# Patient Record
Sex: Male | Born: 1965
Health system: Southern US, Community
[De-identification: ages and names within clinical notes are randomized; demographics above are authoritative.]

## PROBLEM LIST (undated history)

## (undated) ENCOUNTER — Ambulatory Visit: Source: Home / Self Care

## (undated) DIAGNOSIS — M109 Gout, unspecified: Secondary | ICD-10-CM

## (undated) DIAGNOSIS — I2699 Other pulmonary embolism without acute cor pulmonale: Secondary | ICD-10-CM

## (undated) DIAGNOSIS — N183 Chronic kidney disease, stage 3 unspecified: Secondary | ICD-10-CM

## (undated) DIAGNOSIS — I82409 Acute embolism and thrombosis of unspecified deep veins of unspecified lower extremity: Secondary | ICD-10-CM

## (undated) DIAGNOSIS — I10 Essential (primary) hypertension: Secondary | ICD-10-CM

## (undated) HISTORY — PX: OTHER SURGICAL HISTORY: SHX169

---

## 2008-11-14 ENCOUNTER — Emergency Department (HOSPITAL_COMMUNITY): Admission: EM | Admit: 2008-11-14 | Discharge: 2008-11-14 | Payer: Self-pay | Admitting: Emergency Medicine

## 2010-02-13 NOTE — ED Provider Notes (Signed)
°

## 2012-11-12 ENCOUNTER — Encounter (HOSPITAL_COMMUNITY): Payer: Self-pay | Admitting: Emergency Medicine

## 2012-11-12 ENCOUNTER — Emergency Department (HOSPITAL_COMMUNITY)
Admission: EM | Admit: 2012-11-12 | Discharge: 2012-11-12 | Disposition: A | Discharge status: Home / Self Care | Payer: 59 | Attending: Emergency Medicine | Admitting: Emergency Medicine

## 2012-11-12 DIAGNOSIS — Y92410 Unspecified street and highway as the place of occurrence of the external cause: Secondary | ICD-10-CM | POA: Insufficient documentation

## 2012-11-12 DIAGNOSIS — S80811A Abrasion, right lower leg, initial encounter: Secondary | ICD-10-CM

## 2012-11-12 DIAGNOSIS — Z23 Encounter for immunization: Secondary | ICD-10-CM | POA: Insufficient documentation

## 2012-11-12 DIAGNOSIS — IMO0002 Reserved for concepts with insufficient information to code with codable children: Secondary | ICD-10-CM | POA: Insufficient documentation

## 2012-11-12 DIAGNOSIS — S39012A Strain of muscle, fascia and tendon of lower back, initial encounter: Secondary | ICD-10-CM

## 2012-11-12 DIAGNOSIS — S336XXA Sprain of sacroiliac joint, initial encounter: Secondary | ICD-10-CM | POA: Insufficient documentation

## 2012-11-12 DIAGNOSIS — Y9389 Activity, other specified: Secondary | ICD-10-CM | POA: Insufficient documentation

## 2012-11-12 MED ORDER — HYDROCODONE-ACETAMINOPHEN 5-325 MG PO TABS: 1.0000 | ORAL_TABLET | ORAL | Status: DC | PRN

## 2012-11-12 MED ORDER — TETANUS-DIPHTH-ACELL PERTUSSIS 5-2.5-18.5 LF-MCG/0.5 IM SUSP
0.5000 mL | Freq: Once | INTRAMUSCULAR | Status: AC
  Administered 2012-11-12: 0.5 mL via INTRAMUSCULAR
  Filled 2012-11-12: qty 0.5

## 2012-11-12 MED ORDER — METHOCARBAMOL 500 MG PO TABS
1000.0000 mg | ORAL_TABLET | Freq: Once | ORAL | Status: DC
  Filled 2012-11-12: qty 2

## 2012-11-12 MED ORDER — HYDROCODONE-ACETAMINOPHEN 5-325 MG PO TABS
1.0000 | ORAL_TABLET | Freq: Once | ORAL | Status: DC
  Filled 2012-11-12: qty 1

## 2012-11-12 MED ORDER — METHOCARBAMOL 500 MG PO TABS: 500.0000 mg | ORAL_TABLET | Freq: Three times a day (TID) | ORAL | Status: DC
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
## 2012-11-12 NOTE — ED Provider Notes (Signed)
Medical screening examination/treatment/procedure(s) were performed by non-physician practitioner and as supervising physician I was immediately available for consultation/collaboration.  EKG Interpretation   None         Anysia Choi T Joshiah Traynham, MD 11/12/12 1554 

## 2012-11-12 NOTE — ED Notes (Signed)
Patient with no complaints at this time. Respirations even and unlabored. Skin warm/dry. Discharge instructions reviewed with patient at this time. Patient given opportunity to voice concerns/ask questions. Patient discharged at this time and left Emergency Department with steady gait.   
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
## 2015-10-14 ENCOUNTER — Other Ambulatory Visit (HOSPITAL_COMMUNITY): Payer: Self-pay | Admitting: Nephrology

## 2015-10-14 DIAGNOSIS — N183 Chronic kidney disease, stage 3 unspecified: Secondary | ICD-10-CM

## 2015-11-01 ENCOUNTER — Ambulatory Visit (HOSPITAL_COMMUNITY): Payer: No Typology Code available for payment source

## 2015-11-07 ENCOUNTER — Ambulatory Visit (HOSPITAL_COMMUNITY)
Admission: RE | Admit: 2015-11-07 | Discharge: 2015-11-07 | Disposition: A | Discharge status: Home / Self Care | Payer: 59 | Source: Ambulatory Visit | Attending: Nephrology | Admitting: Nephrology

## 2015-11-07 DIAGNOSIS — N183 Chronic kidney disease, stage 3 unspecified: Secondary | ICD-10-CM

## 2015-11-28 ENCOUNTER — Emergency Department (HOSPITAL_COMMUNITY): Payer: 59

## 2015-11-28 ENCOUNTER — Observation Stay (HOSPITAL_COMMUNITY)
Admission: EM | Admit: 2015-11-28 | Discharge: 2015-11-29 | Disposition: A | Discharge status: Home / Self Care | Payer: 59 | Attending: Internal Medicine | Admitting: Internal Medicine

## 2015-11-28 ENCOUNTER — Encounter (HOSPITAL_COMMUNITY): Payer: Self-pay | Admitting: *Deleted

## 2015-11-28 DIAGNOSIS — I82401 Acute embolism and thrombosis of unspecified deep veins of right lower extremity: Secondary | ICD-10-CM | POA: Diagnosis not present

## 2015-11-28 DIAGNOSIS — I2699 Other pulmonary embolism without acute cor pulmonale: Principal | ICD-10-CM | POA: Diagnosis present

## 2015-11-28 DIAGNOSIS — N183 Chronic kidney disease, stage 3 unspecified: Secondary | ICD-10-CM | POA: Diagnosis present

## 2015-11-28 DIAGNOSIS — F4321 Adjustment disorder with depressed mood: Secondary | ICD-10-CM | POA: Diagnosis not present

## 2015-11-28 DIAGNOSIS — Z79899 Other long term (current) drug therapy: Secondary | ICD-10-CM | POA: Insufficient documentation

## 2015-11-28 DIAGNOSIS — J811 Chronic pulmonary edema: Secondary | ICD-10-CM | POA: Insufficient documentation

## 2015-11-28 DIAGNOSIS — I82409 Acute embolism and thrombosis of unspecified deep veins of unspecified lower extremity: Secondary | ICD-10-CM | POA: Diagnosis present

## 2015-11-28 DIAGNOSIS — R0602 Shortness of breath: Secondary | ICD-10-CM | POA: Diagnosis present

## 2015-11-28 DIAGNOSIS — I129 Hypertensive chronic kidney disease with stage 1 through stage 4 chronic kidney disease, or unspecified chronic kidney disease: Secondary | ICD-10-CM | POA: Diagnosis not present

## 2015-11-28 DIAGNOSIS — I82411 Acute embolism and thrombosis of right femoral vein: Secondary | ICD-10-CM

## 2015-11-28 DIAGNOSIS — R0600 Dyspnea, unspecified: Secondary | ICD-10-CM

## 2015-11-28 DIAGNOSIS — I10 Essential (primary) hypertension: Secondary | ICD-10-CM | POA: Diagnosis present

## 2015-11-28 DIAGNOSIS — N289 Disorder of kidney and ureter, unspecified: Secondary | ICD-10-CM | POA: Diagnosis not present

## 2015-11-28 DIAGNOSIS — R778 Other specified abnormalities of plasma proteins: Secondary | ICD-10-CM | POA: Diagnosis present

## 2015-11-28 DIAGNOSIS — R7989 Other specified abnormal findings of blood chemistry: Secondary | ICD-10-CM

## 2015-11-28 DIAGNOSIS — R748 Abnormal levels of other serum enzymes: Secondary | ICD-10-CM

## 2015-11-28 HISTORY — DX: Essential (primary) hypertension: I10

## 2015-11-28 HISTORY — DX: Chronic kidney disease, stage 3 unspecified: N18.30

## 2015-11-28 HISTORY — DX: Chronic kidney disease, stage 3 (moderate): N18.3

## 2015-11-28 LAB — TROPONIN I
TROPONIN I: 0.06 ng/mL — AB (ref <0.03)
TROPONIN I: 0.04 ng/mL — AB (ref <0.03)

## 2015-11-28 LAB — HEPARIN LEVEL (UNFRACTIONATED): Heparin Unfractionated: 0.4 IU/mL (ref 0.30–0.70)

## 2015-11-28 LAB — CBC
HCT: 46.6 % (ref 39.0–52.0)
HEMOGLOBIN: 15.4 g/dL (ref 13.0–17.0)
MCH: 29.2 pg (ref 26.0–34.0)
MCHC: 33 g/dL (ref 30.0–36.0)
MCV: 88.4 fL (ref 78.0–100.0)
Platelets: 208 10*3/uL (ref 150–400)
RBC: 5.27 MIL/uL (ref 4.22–5.81)
RDW: 12.8 % (ref 11.5–15.5)
WBC: 10.7 10*3/uL — AB (ref 4.0–10.5)

## 2015-11-28 LAB — BASIC METABOLIC PANEL
ANION GAP: 9 (ref 5–15)
BUN: 31 mg/dL — ABNORMAL HIGH (ref 6–20)
CHLORIDE: 103 mmol/L (ref 101–111)
CO2: 24 mmol/L (ref 22–32)
Calcium: 9.6 mg/dL (ref 8.9–10.3)
Creatinine, Ser: 2.28 mg/dL — ABNORMAL HIGH (ref 0.61–1.24)
GFR calc non Af Amer: 32 mL/min — ABNORMAL LOW (ref >60)
GFR, EST AFRICAN AMERICAN: 37 mL/min — AB (ref >60)
Glucose, Bld: 89 mg/dL (ref 65–99)
Potassium: 4.7 mmol/L (ref 3.5–5.1)
SODIUM: 136 mmol/L (ref 135–145)

## 2015-11-28 LAB — D-DIMER, QUANTITATIVE: D-Dimer, Quant: 13.16 ug/mL-FEU — ABNORMAL HIGH (ref 0.00–0.50)

## 2015-11-28 LAB — PROTIME-INR
INR: 1.08
PROTHROMBIN TIME: 14 s (ref 11.4–15.2)

## 2015-11-28 MED ORDER — TECHNETIUM TO 99M ALBUMIN AGGREGATED
4.0000 | Freq: Once | INTRAVENOUS | Status: AC | PRN
  Administered 2015-11-28: 4 via INTRAVENOUS

## 2015-11-28 MED ORDER — LACTATED RINGERS IV SOLN
INTRAVENOUS | Status: DC
  Administered 2015-11-28: 23:00:00 via INTRAVENOUS

## 2015-11-28 MED ORDER — ONDANSETRON HCL 4 MG/2ML IJ SOLN: 4.0000 mg | Freq: Four times a day (QID) | INTRAMUSCULAR | Status: DC | PRN

## 2015-11-28 MED ORDER — LABETALOL HCL 5 MG/ML IV SOLN: 5.0000 mg | INTRAVENOUS | Status: DC | PRN

## 2015-11-28 MED ORDER — HYDROCODONE-ACETAMINOPHEN 5-325 MG PO TABS: 1.0000 | ORAL_TABLET | ORAL | Status: DC | PRN

## 2015-11-28 MED ORDER — TECHNETIUM TC 99M DIETHYLENETRIAME-PENTAACETIC ACID
30.0000 | Freq: Once | INTRAVENOUS | Status: AC | PRN
  Administered 2015-11-28: 32 via RESPIRATORY_TRACT

## 2015-11-28 MED ORDER — ACETAMINOPHEN 650 MG RE SUPP: 650.0000 mg | Freq: Four times a day (QID) | RECTAL | Status: DC | PRN

## 2015-11-28 MED ORDER — HEPARIN BOLUS VIA INFUSION
5000.0000 [IU] | Freq: Once | INTRAVENOUS | Status: AC
  Administered 2015-11-28: 5000 [IU] via INTRAVENOUS

## 2015-11-28 MED ORDER — ONDANSETRON HCL 4 MG PO TABS: 4.0000 mg | ORAL_TABLET | Freq: Four times a day (QID) | ORAL | Status: DC | PRN

## 2015-11-28 MED ORDER — ACETAMINOPHEN 325 MG PO TABS: 650.0000 mg | ORAL_TABLET | Freq: Four times a day (QID) | ORAL | Status: DC | PRN

## 2015-11-28 MED ORDER — HEPARIN (PORCINE) IN NACL 100-0.45 UNIT/ML-% IJ SOLN
1650.0000 [IU]/h | INTRAMUSCULAR | Status: DC
  Administered 2015-11-28 – 2015-11-29 (×2): 1650 [IU]/h via INTRAVENOUS
  Filled 2015-11-28 (×2): qty 250

## 2015-11-28 MED ORDER — HEPARIN SODIUM (PORCINE) 5000 UNIT/ML IJ SOLN: 4000.0000 [IU] | Freq: Once | INTRAMUSCULAR | Status: DC

## 2015-11-28 MED ORDER — SENNOSIDES-DOCUSATE SODIUM 8.6-50 MG PO TABS: 1.0000 | ORAL_TABLET | Freq: Every evening | ORAL | Status: DC | PRN

## 2015-11-28 NOTE — ED Triage Notes (Signed)
On Saturday pt states the he began having a racing heart and shortness of breath. He said this episode went away then occurred again later that night. Denies any pain or "racing heart" feelings at this time. States he has had a pressure in his chest over the weekend.

## 2015-11-28 NOTE — ED Notes (Signed)
Heparin bolus complete at this time. 

## 2015-11-28 NOTE — ED Notes (Signed)
Pt taken to radiology by Pattricia BossAnnie.

## 2015-11-28 NOTE — ED Notes (Signed)
Went to take pt up to room 327 Dr still in with pt. Will check back

## 2015-11-28 NOTE — ED Notes (Signed)
Pt returned from radiology.

## 2015-11-28 NOTE — H&P (Signed)
History and Physical    Ronald Gonzales:[id number] DOB: 1965-08-07 DOA: 11/28/2015  PCP: Colette Ribas, MD Consultants:  Wolf[facility] - nephrology Patient coming from: home - lives with wife, son; [place]: wife, [phone]  Chief Complaint: leg pain  HPI: Ronald Gonzales is a 50 y.o. male with medical history significant of HTN and CKD presenting with DVT/PE.  He reports that he felt fine on Saturday morning but on Saturday afternoon about 3pm, he used the bathroom and his heart started racing and he got SOB "like I've been running a marathon."  Calmed down about 10 minutes later but still didn't feel right.  Micah Flesher out with friends that night and when leaving, he was walking about 300 feet while carrying a large piece of artwork.  When he got to the car, he couldn't even lift it into the car because he was so exhausted.  A few minutes later, he was able to get his breath back.  Still not feeling himself that night and yesterday.  Also noticed R leg pain in the upper thigh region.  Was limping around and slept all afternoon yesterday.  Awoke about 6pm and got up for a while but then again slept through the night.  When he got up this AM, he decided to go in and be seen; he went to PCP and they referred him to the ER.  Does have decreased velocity of urine stream without other urinary or GU symptoms.  +constipation.  +anhedonia.  No SI/HI.  ED Course:  Per Dr. Adriana Simas: Doppler study right lower extremity shows an extensive DVT in his RLE. I was unable to do a CT angiogram of his chest secondary to his elevated creatinine. However, a V/ Q study of chest reveals a high probability of a pulmonary embolism.  Rx IV heparin. Admit to general medicine.    Review of Systems: As per HPI; otherwise 10 point review of systems reviewed and negative.   Ambulatory Status:  Ambulates without assistance  Past Medical History:  Diagnosis Date  . CKD (chronic kidney disease) stage 3, GFR 30-59 ml/min   . Hypertension     april 2017    History reviewed. No pertinent surgical history.  Social History   Social History  . Marital status: Married    Spouse name: N/A  . Number of children: N/A  . Years of education: N/A   Occupational History  . Acupuncturist    Social History Main Topics  . Smoking status: Never Smoker  . Smokeless tobacco: Never Used  . Alcohol use Yes     Comment: occasional  . Drug use: No  . Sexual activity: Not on file   Other Topics Concern  . Not on file   Social History Narrative  . No narrative on file    No Known Allergies  Family History  Problem Relation Age of Onset  . CVA Father     Prior to Admission medications   Medication Sig Start Date End Date Taking? Authorizing Provider  Flaxseed, Linseed, (FLAX SEED OIL PO) Take 1 capsule by mouth daily.   Yes Historical Provider, MD  lisinopril-hydrochlorothiazide (PRINZIDE,ZESTORETIC) 20-25 MG tablet Take 0.5 tablets by mouth daily.  09/01/15  Yes Historical Provider, MD    Physical Exam: Vitals:   11/28/15 1800 11/28/15 1927 11/28/15 2000 11/28/15 2207  BP: 130/81 117/81  127/78  Pulse: 83 86  (!) 101  Resp: 20 19  20   Temp:    98.4 F (36.9  C)  TempSrc:    Oral  SpO2: 97% 97%  96%  Weight:   125.5 kg (276 lb 9.6 oz)   Height:   5\' 7"  (1.702 m)      General:  Appears calm and comfortable and is NAD Eyes:  PERRL, EOMI, normal lids, iris ENT:  grossly normal hearing, lips & tongue, mmm Neck:  no LAD, masses or thyromegaly Cardiovascular:  RRR, no m/r/g. No LE edema.  Respiratory:  CTA bilaterally, no w/r/r. Normal respiratory effort. Abdomen:  soft, ntnd, NABS Skin:  no rash or induration seen on limited exam; there is no erythema or warmth of the R medial thigh Musculoskeletal:  grossly normal tone BUE/BLE, good ROM, no bony abnormality; +TTP of the R medial thigh without obvious edema Psychiatric:  grossly normal mood and somewhat blunted affect, speech fluent and appropriate,  AOx3 Neurologic:  CN 2-12 grossly intact, moves all extremities in coordinated fashion, sensation intact  Labs on Admission: I have personally reviewed following labs and imaging studies  CBC:  Recent Labs Lab 11/28/15 1138  WBC 10.7*  HGB 15.4  HCT 46.6  MCV 88.4  PLT 208   Basic Metabolic Panel:  Recent Labs Lab 11/28/15 1138  NA 136  K 4.7  CL 103  CO2 24  GLUCOSE 89  BUN 31*  CREATININE 2.28*  CALCIUM 9.6   GFR: Estimated Creatinine Clearance: 49.8 mL/min (by C-G formula based on SCr of 2.28 mg/dL (H)). Liver Function Tests: No results for input(s): AST, ALT, ALKPHOS, BILITOT, PROT, ALBUMIN in the last 168 hours. No results for input(s): LIPASE, AMYLASE in the last 168 hours. No results for input(s): AMMONIA in the last 168 hours. Coagulation Profile: No results for input(s): INR, PROTIME in the last 168 hours. Cardiac Enzymes:  Recent Labs Lab 11/28/15 1138  TROPONINI 0.06*   BNP (last 3 results) No results for input(s): PROBNP in the last 8760 hours. HbA1C: No results for input(s): HGBA1C in the last 72 hours. CBG: No results for input(s): GLUCAP in the last 168 hours. Lipid Profile: No results for input(s): CHOL, HDL, LDLCALC, TRIG, CHOLHDL, LDLDIRECT in the last 72 hours. Thyroid Function Tests: No results for input(s): TSH, T4TOTAL, FREET4, T3FREE, THYROIDAB in the last 72 hours. Anemia Panel: No results for input(s): VITAMINB12, FOLATE, FERRITIN, TIBC, IRON, RETICCTPCT in the last 72 hours. Urine analysis: No results found for: COLORURINE, APPEARANCEUR, LABSPEC, PHURINE, GLUCOSEU, HGBUR, BILIRUBINUR, KETONESUR, PROTEINUR, UROBILINOGEN, NITRITE, LEUKOCYTESUR  Creatinine Clearance: Estimated Creatinine Clearance: 49.8 mL/min (by C-G formula based on SCr of 2.28 mg/dL (H)).  Sepsis Labs: @LABRCNTIP (procalcitonin:4,lacticidven:4) )No results found for this or any previous visit (from the past 240 hour(s)).   Radiological Exams on Admission: Dg  Chest 2 View  Result Date: 11/28/2015 CLINICAL DATA:  Chest pressure.  Rapid heart rate. EXAM: CHEST  2 VIEW COMPARISON:  No recent prior . FINDINGS: Mediastinum hilar structures normal. Low lung volumes. Mild left base infiltrate. No pleural effusion or pneumothorax. No acute bony abnormality. IMPRESSION: Low lung volumes.  Mild left base infiltrate. Electronically Signed   By: Maisie Fushomas  Register   On: 11/28/2015 11:23   Nm Pulmonary Perf And Vent  Addendum Date: 11/28/2015   ADDENDUM REPORT: 11/28/2015 17:10 ADDENDUM: Critical Value/emergent results were called by telephone at the time of interpretation on 11/28/2015 at 5:10 pm to Dr. Donnetta HutchingBRIAN COOK , who verbally acknowledged these results. Electronically Signed   By: Bretta BangWilliam  Woodruff III M.D.   On: 11/28/2015 17:10   Result Date: 11/28/2015  CLINICAL DATA:  Shortness of breath for 3 days EXAM: NUCLEAR MEDICINE VENTILATION - PERFUSION LUNG SCAN VIEWS: Anterior, posterior, left lateral, right lateral, RPO, LPO, RAO, LAO -ventilation and perfusion RADIOPHARMACEUTICALS:  32.0 mCi Technetium-66m DTPA aerosol inhalation and 4.0 mCi Technetium-66m MAA IV COMPARISON:  Chest radiograph November 28, 2015 FINDINGS: Ventilation: Radiotracer uptake on the ventilation study is homogeneous and symmetric bilaterally. No focal ventilation defects identified. Perfusion: On the perfusion study, there is significant decreased radiotracer uptake in the superior and inferior segments of the lingula, best appreciable on the LPO and left lateral views. On the right lateral and RPO views, there is decreased uptake in the anterior segment right upper lobe. These areas appear to represent significant ventilation/perfusion mismatches. IMPRESSION: Segmental perfusion defects are noted in the superior and inferior segments of the lingula and in the anterior segment of the right upper lobe. There are no corresponding ventilation defects. This study is felt to represent an overall high  probability of pulmonary embolus. Electronically Signed: By: Bretta Bang III M.D. On: 11/28/2015 17:05   US Venous Img Lower Unilateral Right  Result Date: 11/28/2015 CLINICAL DATA:  50 year old male with right thigh pain and shortness breath EXAM: RIGHT LOWER EXTREMITY VENOUS DOPPLER ULTRASOUND TECHNIQUE: Gray-scale sonography with graded compression, as well as color Doppler and duplex ultrasound were performed to evaluate the lower extremity deep venous systems from the level of the common femoral vein and including the common femoral, femoral, profunda femoral, popliteal and calf veins including the posterior tibial, peroneal and gastrocnemius veins when visible. The superficial great saphenous vein was also interrogated. Spectral Doppler was utilized to evaluate flow at rest and with distal augmentation maneuvers in the common femoral, femoral and popliteal veins. COMPARISON:  None. FINDINGS: Contralateral Common Femoral Vein: Respiratory phasicity is normal and symmetric with the symptomatic side. No evidence of thrombus. Normal compressibility. Common Femoral Vein: Partially compressible. Nonocclusive thrombus is noted within the common femoral vein. Saphenofemoral Junction: No evidence of thrombus. Normal compressibility and flow on color Doppler imaging. Profunda Femoral Vein: Occlusive thrombus within the profunda femoral vein. Femoral Vein: Occlusive thrombus extends into the femoral vein throughout the thigh. The vessels expanded and contains low-level internal echoes. Noncompressible. Popliteal Vein: Thrombus extends throughout the popliteal vein. Calf Veins: Thrombus extends into the visualized deep calf veins. Superficial Great Saphenous Vein: No evidence of thrombus. Normal compressibility and flow on color Doppler imaging. Venous Reflux:  None. Other Findings:  None. IMPRESSION: Positive for extensive deep venous thrombosis throughout the right lower extremity with nonocclusive thrombus in  the common femoral vein, and occlusive thrombus in the femoral vein, profunda femoral vein, popliteal vein and visualized calf veins. Electronically Signed   By: Malachy Moan M.D.   On: 11/28/2015 14:42    EKG: Independently reviewed.  NSR with rate 95; normal EKG  Assessment/Plan Principal Problem:   Acute pulmonary embolism (HCC) Active Problems:   DVT (deep venous thrombosis) (HCC)   CKD (chronic kidney disease) stage 3, GFR 30-59 ml/min   Essential hypertension   Elevated troponin   Adjustment disorder with depressed mood   DVT/PE -Atypical presentation - no significant leg PE findings, no tachycardia, normal O2 sats - the ER doctor did a good job to find this! -After long discussion of possible reasons, immobility associated with his job and lifestyle are the most likely reasons (he sits for long periods of time and drives a fair amount) -That said, he does have constipation that is fairly new for him and  is 50yo; I recommended that he go ahead and call to schedule his outpatient screening colonoscopy now. -He does have some urinary stream velocity issues and prostate cancer screening should also be discussed - but the risk/benefits here are much less clear -CT C/A/P for screening is not indicated at this time due to lack of other symptoms -Patient started on Heparin drip in ER; we discussed Lovenox instead but the patient prefers Heparin at this time -We also discussed long-term anticoagulation, as he will need at least 3-6 months of treatment -He prefers not to have Lovenox at all and also wishes to avoid Coumadin -Will, therefore, need NOAC; CM consult requested to discuss options based on cost -Normally, an extensive DVT with PE would clearly require hospitalization with telemetry; in this case with his hemodynamic stability and clinical presentation, anticipate discharge tomorrow if no new issues arise  CKD -Patient is uncertain what his baseline renal function is -He has  seen nephrology once and had a normal renal US -Suspect that this is the result of long-standing suboptimal HTN control -He may also have a component of AKI at this time; will hydrate and follow -Hold ACE and HCTZ  HTN -Moderate control in ER -Will hold home meds (ACE/HCTZ) due to renal dysfunction and possible component of AKI -Cover with Labetalol prn  Elevated troponin -Normal EKG -No chest pain -Normal vitals -Possible troponin leak due to AKI/CKD vs. demand ischemia -Will trend troponins -Based on normal vitals and EKG and no symptoms, will not continue to monitor on telemetry at this time  Adjustment disorder -We had a long discussion about this -Patient with anhedonia, lack of interest in anything once he gets home from work -He is very unhappy with his job and this seems to be impacting the rest of his life -Patient strongly encouraged to consider SSRI therapy -He is not interested in counseling at this time -Denies SI/HI -Suggest outpatient f/u   DVT prophylaxis: Heparin drip Code Status: DNI - confirmed with patient/family Family Communication: Wife present throughout evaluation  Disposition Plan:  Home once clinically improved Consults called: None  Admission status: It is my clinical opinion that referral for OBSERVATION is reasonable and necessary in this patient based on the above information provided. The aforementioned taken together are felt to place the patient at high risk for further clinical deterioration. However it is anticipated that the patient may be medically stable for discharge from the hospital within 24 to 48 hours.     Ronald BlueJennifer Kaycen Whitworth MD [facility]  If 7PM-7AM, please contact night-coverage [email or web address] Password [email or web address]  11/28/2015, 10:12 PM

## 2015-11-28 NOTE — ED Notes (Signed)
Radiology discontinued heparin drip for test, heparin drip restarted at 1650 units/hr.

## 2015-11-28 NOTE — ED Provider Notes (Signed)
[facility]-EM[facility]Y DEPT Provider Note   CSN: [id number] Arrival date & time: 11/28/15  1013  By signing my name below, I, Majel Homer, attest that this documentation has been prepared under the direction and in the presence of Donnetta Hutching, MD . Electronically Signed: Majel Homer, Scribe. 11/28/2015. 1:02 PM.  History   Chief Complaint Chief Complaint  Patient presents with  . Chest Pain   The history is provided by the patient. No language interpreter was used.   HPI Comments: Ronald Gonzales is a 50 y.o. male with PMHx of HTN, who presents to the Emergency Department complaining of gradually worsening, shortness of breath that began 3 days ago. Pt reports his symptoms began with a sensation of palpitations 3 days ago followed by an episode of shortness of breath; he notes this episode lasted ~20 seconds. He notes he tried to carry a large painting that same evening at ~11:00 in the evening when he experienced another episode of shortness of breath. He states he was walking up the stairs at church yesterday in which he began to feel "winded" and mildly short of breath again. He denies chest pain during any of these episodes. Pt states this is the first time he has experienced similar symptoms. He notes he received blood work at his PCP's office 3 weeks ago and was told of abnormal creatinine levels. He states he then received an ultrasound of his kidneys 3 weeks ago that returned normal. Pt denies FHx of heart problems at an early age; though, he notes his mother and father had issues as they got older. Pt also complains of a "painful spot" to his right inner thigh but denies any injury.   PCP: Dr. Phillips Odor   Past Medical History:  Diagnosis Date  . Hypertension    april 2017   Patient Active Problem List   Diagnosis Date Noted  . Pulmonary edema 11/28/2015   History reviewed. No pertinent surgical history.  Home Medications    Prior to Admission medications   Medication Sig Start Date End  Date Taking? Authorizing Provider  Flaxseed, Linseed, (FLAX SEED OIL PO) Take 1 capsule by mouth daily.   Yes Historical Provider, MD  lisinopril-hydrochlorothiazide (PRINZIDE,ZESTORETIC) 20-25 MG tablet Take 0.5 tablets by mouth daily.  09/01/15  Yes Historical Provider, MD    Family History No family history on file.  Social History Social History  Substance Use Topics  . Smoking status: Never Smoker  . Smokeless tobacco: Never Used  . Alcohol use No   Allergies   Patient has no known allergies.  Review of Systems Review of Systems  Respiratory: Positive for shortness of breath.   Cardiovascular: Positive for palpitations.  Musculoskeletal:       Painful spot to his right proximal medial thigh    Physical Exam Updated Vital Signs BP 116/81 (BP Location: Left Arm)   Pulse 93   Temp 97.5 F (36.4 C) (Oral)   Resp 18   Ht 5\' 11"  (1.803 m)   Wt 280 lb (127 kg)   SpO2 100%   BMI 39.05 kg/m   Physical Exam  Constitutional: He is oriented to person, place, and time. He appears well-developed and well-nourished.  Obese but no acute distress  HENT:  Head: Normocephalic and atraumatic.  Eyes: Conjunctivae are normal.  Neck: Neck supple.  Cardiovascular: Normal rate and regular rhythm.   Pulmonary/Chest: Effort normal and breath sounds normal.  Abdominal: Soft. Bowel sounds are normal.  Musculoskeletal: Normal range of motion.  Neurological: He is alert and oriented to person, place, and time.  Skin: Skin is warm and dry.  Psychiatric: He has a normal mood and affect. His behavior is normal.  Nursing note and vitals reviewed.  ED Treatments / Results  Labs (all labs ordered are listed, but only abnormal results are displayed) Labs Reviewed  BASIC METABOLIC PANEL - Abnormal; Notable for the following:       Result Value   BUN 31 (*)    Creatinine, Ser 2.28 (*)    GFR calc non Af Amer 32 (*)    GFR calc Af Amer 37 (*)    All other components within normal limits    CBC - Abnormal; Notable for the following:    WBC 10.7 (*)    All other components within normal limits  TROPONIN I - Abnormal; Notable for the following:    Troponin I 0.06 (*)    All other components within normal limits  D-DIMER, QUANTITATIVE (NOT AT [facility]) - Abnormal; Notable for the following:    D-Dimer, Quant 13.16 (*)    All other components within normal limits  PROTIME-INR  HEPARIN LEVEL (UNFRACTIONATED)  HEPARIN LEVEL (UNFRACTIONATED)  CBC  PROTIME-INR    EKG  EKG Interpretation None       Radiology Dg Chest 2 View  Result Date: 11/28/2015 CLINICAL DATA:  Chest pressure.  Rapid heart rate. EXAM: CHEST  2 VIEW COMPARISON:  No recent prior . FINDINGS: Mediastinum hilar structures normal. Low lung volumes. Mild left base infiltrate. No pleural effusion or pneumothorax. No acute bony abnormality. IMPRESSION: Low lung volumes.  Mild left base infiltrate. Electronically Signed   By: Maisie Fus  Register   On: 11/28/2015 11:23   Nm Pulmonary Perf And Vent  Addendum Date: 11/28/2015   ADDENDUM REPORT: 11/28/2015 17:10 ADDENDUM: Critical Value/emergent results were called by telephone at the time of interpretation on 11/28/2015 at 5:10 pm to Dr. Donnetta Hutching , who verbally acknowledged these results. Electronically Signed   By: Bretta Bang III M.D.   On: 11/28/2015 17:10   Result Date: 11/28/2015 CLINICAL DATA:  Shortness of breath for 3 days EXAM: NUCLEAR MEDICINE VENTILATION - PERFUSION LUNG SCAN VIEWS: Anterior, posterior, left lateral, right lateral, RPO, LPO, RAO, LAO -ventilation and perfusion RADIOPHARMACEUTICALS:  32.0 mCi Technetium-2m DTPA aerosol inhalation and 4.0 mCi Technetium-78m MAA IV COMPARISON:  Chest radiograph November 28, 2015 FINDINGS: Ventilation: Radiotracer uptake on the ventilation study is homogeneous and symmetric bilaterally. No focal ventilation defects identified. Perfusion: On the perfusion study, there is significant decreased radiotracer uptake in  the superior and inferior segments of the lingula, best appreciable on the LPO and left lateral views. On the right lateral and RPO views, there is decreased uptake in the anterior segment right upper lobe. These areas appear to represent significant ventilation/perfusion mismatches. IMPRESSION: Segmental perfusion defects are noted in the superior and inferior segments of the lingula and in the anterior segment of the right upper lobe. There are no corresponding ventilation defects. This study is felt to represent an overall high probability of pulmonary embolus. Electronically Signed: By: Bretta Bang III M.D. On: 11/28/2015 17:05   US Venous Img Lower Unilateral Right  Result Date: 11/28/2015 CLINICAL DATA:  50 year old male with right thigh pain and shortness breath EXAM: RIGHT LOWER EXTREMITY VENOUS DOPPLER ULTRASOUND TECHNIQUE: Gray-scale sonography with graded compression, as well as color Doppler and duplex ultrasound were performed to evaluate the lower extremity deep venous systems from the level of the common  femoral vein and including the common femoral, femoral, profunda femoral, popliteal and calf veins including the posterior tibial, peroneal and gastrocnemius veins when visible. The superficial great saphenous vein was also interrogated. Spectral Doppler was utilized to evaluate flow at rest and with distal augmentation maneuvers in the common femoral, femoral and popliteal veins. COMPARISON:  None. FINDINGS: Contralateral Common Femoral Vein: Respiratory phasicity is normal and symmetric with the symptomatic side. No evidence of thrombus. Normal compressibility. Common Femoral Vein: Partially compressible. Nonocclusive thrombus is noted within the common femoral vein. Saphenofemoral Junction: No evidence of thrombus. Normal compressibility and flow on color Doppler imaging. Profunda Femoral Vein: Occlusive thrombus within the profunda femoral vein. Femoral Vein: Occlusive thrombus extends  into the femoral vein throughout the thigh. The vessels expanded and contains low-level internal echoes. Noncompressible. Popliteal Vein: Thrombus extends throughout the popliteal vein. Calf Veins: Thrombus extends into the visualized deep calf veins. Superficial Great Saphenous Vein: No evidence of thrombus. Normal compressibility and flow on color Doppler imaging. Venous Reflux:  None. Other Findings:  None. IMPRESSION: Positive for extensive deep venous thrombosis throughout the right lower extremity with nonocclusive thrombus in the common femoral vein, and occlusive thrombus in the femoral vein, profunda femoral vein, popliteal vein and visualized calf veins. Electronically Signed   By: Malachy MoanHeath  McCullough M.D.   On: 11/28/2015 14:42   Procedures Procedures (including critical care time)  Medications Ordered in ED Medications  heparin ADULT infusion 100 units/mL (25000 units/21850mL sodium chloride 0.45%) (1,650 Units/hr Intravenous New Bag/Given 11/28/15 1501)  heparin bolus via infusion 5,000 Units (5,000 Units Intravenous Bolus from Bag 11/28/15 1500)  technetium albumin aggregated (MAA) injection solution 4 millicurie (4 millicuries Intravenous Contrast Given 11/28/15 1625)  technetium TC 62M diethylenetriame-pentaacetic acid (DTPA) injection 30 millicurie (32 millicuries Inhalation Given 11/28/15 1615)    DIAGNOSTIC STUDIES:  Oxygen Saturation is 100% on RA, normal by my interpretation.    COORDINATION OF CARE:  12:53 PM Discussed treatment plan with pt at bedside and pt agreed to plan.  Initial Impression / Assessment and Plan / ED Course  I have reviewed the triage vital signs and the nursing notes.  Pertinent labs & imaging results that were available during my care of the patient were reviewed by me and considered in my medical decision making (see chart for details).  Clinical Course     Will obtain CXR, blood work, EKG and US of his right leg to rule out a DVT.    Doppler  study right lower extremity shows an extensive DVT in his RLE. I was unable to do a CT angiogram of his chest secondary to his elevated creatinine. However, a V/ Q study of chest reveals a high probability of a pulmonary embolism.  Rx IV heparin. Admit to general medicine.    CRITICAL CARE Performed by: Donnetta HutchingOOK,Shyniece Scripter  ?  Total critical care time: 30 minutes  Critical care time was exclusive of separately billable procedures and treating other patients.  Critical care was necessary to treat or prevent imminent or life-threatening deterioration.  Critical care was time spent personally by me on the following activities: development of treatment plan with patient and/or surrogate as well as nursing, discussions with consultants, evaluation of patient's response to treatment, examination of patient, obtaining history from patient or surrogate, ordering and performing treatments and interventions, ordering and review of laboratory studies, ordering and review of radiographic studies, pulse oximetry and re-evaluation of patient's condition. I personally performed the services described in this documentation, which was  scribed in my presence. The recorded information has been reviewed and is accurate.   Final Clinical Impressions(s) / ED Diagnoses   Final diagnoses:  Dyspnea  Other acute pulmonary embolism without acute cor pulmonale (HCC)  Acute deep vein thrombosis (DVT) of femoral vein of right lower extremity (HCC)  Renal insufficiency    New Prescriptions New Prescriptions   No medications on file     Donnetta HutchingBrian Kelvyn Schunk, MD 11/28/15 1745

## 2015-11-28 NOTE — ED Notes (Signed)
CRITICAL VALUE ALERT  Critical value received:  Troponin 0.06  Date of notification: 11/28/15  Time of notification:    1224  Critical value read back:Yes.    Nurse who received alert:  Viviano SimasLauren Shadara Lopez, RN  MD notified (1st page):  [facility]

## 2015-11-28 NOTE — Progress Notes (Signed)
ANTICOAGULATION CONSULT NOTE - Initial Consult  Pharmacy Consult for Heparin Indication: pulmonary embolus  No Known Allergies  Patient Measurements: Height: 5\' 11"  (180.3 cm) Weight: 280 lb (127 kg) IBW/kg (Calculated) : 75.3 Heparin Dosing Weight: 103.9 kg  Vital Signs: Temp: 97.5 F (36.4 C) (11/06 1043) Temp Source: Oral (11/06 1043) BP: 117/85 (11/06 1328) Pulse Rate: 83 (11/06 1328)  Labs:  Recent Labs  11/28/15 1138  HGB 15.4  HCT 46.6  PLT 208  CREATININE 2.28*  TROPONINI 0.06*    Estimated Creatinine Clearance: 53.2 mL/min (by C-G formula based on SCr of 2.28 mg/dL (H)).   Medical History: Past Medical History:  Diagnosis Date  . Hypertension    april 2017    Medications:   (Not in a hospital admission)  Assessment: 50 yo male ED patient Pharmacy protocol heparin for PE Labs reviewed PTA medications reviewed  Goal of Therapy:  Heparin level 0.3-0.7 units/ml Monitor platelets by anticoagulation protocol: Yes   Plan:  Give 5000 units bolus x 1 Start heparin infusion at 1650 units/hr Check anti-Xa level in 6-8 hours and daily while on heparin Continue to monitor H&H and platelets  Raquel JamesPittman, Adeleine Pask Bennett 11/28/2015,2:51 PM

## 2015-11-28 NOTE — ED Notes (Signed)
Ambulated to restroom with no difficulties.

## 2015-11-28 NOTE — ED Notes (Signed)
Taking pt to [address].

## 2015-11-29 DIAGNOSIS — I2690 Septic pulmonary embolism without acute cor pulmonale: Secondary | ICD-10-CM

## 2015-11-29 LAB — CBC
HEMATOCRIT: 44.4 % (ref 39.0–52.0)
HEMOGLOBIN: 14.6 g/dL (ref 13.0–17.0)
MCH: 29 pg (ref 26.0–34.0)
MCHC: 32.9 g/dL (ref 30.0–36.0)
MCV: 88.1 fL (ref 78.0–100.0)
Platelets: 216 10*3/uL (ref 150–400)
RBC: 5.04 MIL/uL (ref 4.22–5.81)
RDW: 12.9 % (ref 11.5–15.5)
WBC: 10.6 10*3/uL — AB (ref 4.0–10.5)

## 2015-11-29 LAB — BASIC METABOLIC PANEL
ANION GAP: 8 (ref 5–15)
BUN: 31 mg/dL — ABNORMAL HIGH (ref 6–20)
CALCIUM: 8.9 mg/dL (ref 8.9–10.3)
CO2: 22 mmol/L (ref 22–32)
Chloride: 104 mmol/L (ref 101–111)
Creatinine, Ser: 1.96 mg/dL — ABNORMAL HIGH (ref 0.61–1.24)
GFR, EST AFRICAN AMERICAN: 44 mL/min — AB (ref >60)
GFR, EST NON AFRICAN AMERICAN: 38 mL/min — AB (ref >60)
GLUCOSE: 87 mg/dL (ref 65–99)
POTASSIUM: 4.1 mmol/L (ref 3.5–5.1)
SODIUM: 134 mmol/L — AB (ref 135–145)

## 2015-11-29 LAB — TROPONIN I
TROPONIN I: 0.03 ng/mL — AB (ref <0.03)
TROPONIN I: 0.03 ng/mL — AB (ref <0.03)

## 2015-11-29 LAB — HEPARIN LEVEL (UNFRACTIONATED): Heparin Unfractionated: 0.34 IU/mL (ref 0.30–0.70)

## 2015-11-29 LAB — PROTIME-INR
INR: 1.11
Prothrombin Time: 14.4 seconds (ref 11.4–15.2)

## 2015-11-29 MED ORDER — RIVAROXABAN 15 MG PO TABS
15.0000 mg | ORAL_TABLET | Freq: Two times a day (BID) | ORAL | Status: DC
  Administered 2015-11-29: 15 mg via ORAL
  Filled 2015-11-29: qty 1

## 2015-11-29 MED ORDER — RIVAROXABAN 20 MG PO TABS: 20.0000 mg | ORAL_TABLET | Freq: Every day | ORAL | 2 refills | Status: DC

## 2015-11-29 MED ORDER — HYDROCODONE-ACETAMINOPHEN 5-325 MG PO TABS: 1.0000 | ORAL_TABLET | ORAL | 0 refills | Status: DC | PRN

## 2015-11-29 MED ORDER — RIVAROXABAN (XARELTO) VTE STARTER PACK (15 & 20 MG)
ORAL_TABLET | ORAL | 0 refills | Status: DC
Start: 2015-11-29 — End: 2016-01-27

## 2015-11-29 NOTE — Discharge Summary (Signed)
Physician Discharge Summary  Ronald MeyerHarvey G Pedone [id number] DOB: 1965/10/22 DOA: 11/28/2015  PCP: Colette RibasGOLDING, JOHN CABOT, MD  Admit date: 11/28/2015 Discharge date: 11/29/2015  Time spent: 45 minutes  Recommendations for Outpatient Follow-up:  -Will be discharged home today. -Advised to follow up with PCP in 2 weeks for follow up on on BP and newly diagnosed DVT/PE.   Discharge Diagnoses:  Principal Problem:   Acute pulmonary embolism (HCC) Active Problems:   DVT (deep venous thrombosis) (HCC)   CKD (chronic kidney disease) stage 3, GFR 30-59 ml/min   Essential hypertension   Elevated troponin   Adjustment disorder with depressed mood   Discharge Condition: Stable and improved  Filed Weights   11/28/15 1043 11/28/15 2000  Weight: 127 kg (280 lb) 125.5 kg (276 lb 9.6 oz)    History of present illness:  As per Dr. Ophelia CharterYates on 11/6: Ronald Gonzales is a 50 y.o. male with medical history significant of HTN and CKD presenting with DVT/PE.  He reports that he felt fine on Saturday morning but on Saturday afternoon about 3pm, he used the bathroom and his heart started racing and he got SOB "like I've been running a marathon."  Calmed down about 10 minutes later but still didn't feel right.  Micah FlesherWent out with friends that night and when leaving, he was walking about 300 feet while carrying a large piece of artwork.  When he got to the car, he couldn't even lift it into the car because he was so exhausted.  A few minutes later, he was able to get his breath back.  Still not feeling himself that night and yesterday.  Also noticed R leg pain in the upper thigh region.  Was limping around and slept all afternoon yesterday.  Awoke about 6pm and got up for a while but then again slept through the night.  When he got up this AM, he decided to go in and be seen; he went to PCP and they referred him to the ER.  Does have decreased velocity of urine stream without other urinary or GU symptoms.  +constipation.   +anhedonia.  No SI/HI.  ED Course:  Per Dr. Adriana Simasook: Doppler study right lower extremity shows an extensive DVT in his RLE. I wasunable to do a CT angiogram of his chest secondary to his elevated creatinine. However, a V/Q study of chest reveals a high probability of a pulmonary embolism. Rx IV heparin. Admit to general medicine.   Hospital Course:   DVT/PE -Has been started on xarelto. -no oxygen requirements.  HTN -Well controlled. -Has been taken off ACE-I/diuretic due to ARF. -Will not start new BP med on DC. -Advised to keep a BP log to bring into his PCP at time of follow up appointment.  ARF -Lisinopril/HCTz have been discontinued. -This will need to be rechecked at hospital follow up appointment. -Cr on Dc is 1.96.  Procedures:  None   Consultations:  None  Discharge Instructions  Discharge Instructions    Diet - low sodium heart healthy    Complete by:  As directed    Increase activity slowly    Complete by:  As directed        Medication List    STOP taking these medications   lisinopril-hydrochlorothiazide 20-25 MG tablet Commonly known as:  PRINZIDE,ZESTORETIC     TAKE these medications   FLAX SEED OIL PO Take 1 capsule by mouth daily.   HYDROcodone-acetaminophen 5-325 MG tablet Commonly known as:  NORCO/VICODIN  Take 1-2 tablets by mouth every 4 (four) hours as needed for moderate pain.   Rivaroxaban 15 & 20 MG Tbpk Take as directed on package: Start with one 15mg  tablet by mouth twice a day with food. On Day 22, switch to one 20mg  tablet once a day with food.   rivaroxaban 20 MG Tabs tablet Commonly known as:  XARELTO Take 1 tablet (20 mg total) by mouth daily with supper.      No Known Allergies Follow-up Information    Colette Ribas, MD. Go on 12/05/2015.   Specialty:  Family Medicine Why:  At 12:30pm for a 1pm appointment Contact information: [address] [place] [address] [phone]            The  results of significant diagnostics from this hospitalization (including imaging, microbiology, ancillary and laboratory) are listed below for reference.    Significant Diagnostic Studies: Dg Chest 2 View  Result Date: 11/28/2015 CLINICAL DATA:  Chest pressure.  Rapid heart rate. EXAM: CHEST  2 VIEW COMPARISON:  No recent prior . FINDINGS: Mediastinum hilar structures normal. Low lung volumes. Mild left base infiltrate. No pleural effusion or pneumothorax. No acute bony abnormality. IMPRESSION: Low lung volumes.  Mild left base infiltrate. Electronically Signed   By: Maisie Fus  Register   On: 11/28/2015 11:23   US Renal  Result Date: 11/07/2015 CLINICAL DATA:  Stage III chronic renal disease EXAM: RENAL / URINARY TRACT ULTRASOUND COMPLETE COMPARISON:  None. FINDINGS: Right Kidney: Length: 10.5 cm. Echogenicity and renal cortical thickness are within normal limits. No mass, perinephric fluid, or hydronephrosis visualized. No sonographically demonstrable calculus or ureterectasis. Left Kidney: Length: 11.6 cm. Echogenicity and renal cortical thickness are within normal limits. No mass, perinephric fluid, or hydronephrosis visualized. No sonographically demonstrable calculus or ureterectasis. Bladder: Appears normal for degree of bladder distention. IMPRESSION: Study within normal limits. Electronically Signed   By: Bretta Bang III M.D.   On: 11/07/2015 10:38   Nm Pulmonary Perf And Vent  Addendum Date: 11/28/2015   ADDENDUM REPORT: 11/28/2015 17:10 ADDENDUM: Critical Value/emergent results were called by telephone at the time of interpretation on 11/28/2015 at 5:10 pm to Dr. Donnetta Hutching , who verbally acknowledged these results. Electronically Signed   By: Bretta Bang III M.D.   On: 11/28/2015 17:10   Result Date: 11/28/2015 CLINICAL DATA:  Shortness of breath for 3 days EXAM: NUCLEAR MEDICINE VENTILATION - PERFUSION LUNG SCAN VIEWS: Anterior, posterior, left lateral, right lateral, RPO, LPO,  RAO, LAO -ventilation and perfusion RADIOPHARMACEUTICALS:  32.0 mCi Technetium-49m DTPA aerosol inhalation and 4.0 mCi Technetium-13m MAA IV COMPARISON:  Chest radiograph November 28, 2015 FINDINGS: Ventilation: Radiotracer uptake on the ventilation study is homogeneous and symmetric bilaterally. No focal ventilation defects identified. Perfusion: On the perfusion study, there is significant decreased radiotracer uptake in the superior and inferior segments of the lingula, best appreciable on the LPO and left lateral views. On the right lateral and RPO views, there is decreased uptake in the anterior segment right upper lobe. These areas appear to represent significant ventilation/perfusion mismatches. IMPRESSION: Segmental perfusion defects are noted in the superior and inferior segments of the lingula and in the anterior segment of the right upper lobe. There are no corresponding ventilation defects. This study is felt to represent an overall high probability of pulmonary embolus. Electronically Signed: By: Bretta Bang III M.D. On: 11/28/2015 17:05   US Venous Img Lower Unilateral Right  Result Date: 11/28/2015 CLINICAL DATA:  50 year old male with right  thigh pain and shortness breath EXAM: RIGHT LOWER EXTREMITY VENOUS DOPPLER ULTRASOUND TECHNIQUE: Gray-scale sonography with graded compression, as well as color Doppler and duplex ultrasound were performed to evaluate the lower extremity deep venous systems from the level of the common femoral vein and including the common femoral, femoral, profunda femoral, popliteal and calf veins including the posterior tibial, peroneal and gastrocnemius veins when visible. The superficial great saphenous vein was also interrogated. Spectral Doppler was utilized to evaluate flow at rest and with distal augmentation maneuvers in the common femoral, femoral and popliteal veins. COMPARISON:  None. FINDINGS: Contralateral Common Femoral Vein: Respiratory phasicity is  normal and symmetric with the symptomatic side. No evidence of thrombus. Normal compressibility. Common Femoral Vein: Partially compressible. Nonocclusive thrombus is noted within the common femoral vein. Saphenofemoral Junction: No evidence of thrombus. Normal compressibility and flow on color Doppler imaging. Profunda Femoral Vein: Occlusive thrombus within the profunda femoral vein. Femoral Vein: Occlusive thrombus extends into the femoral vein throughout the thigh. The vessels expanded and contains low-level internal echoes. Noncompressible. Popliteal Vein: Thrombus extends throughout the popliteal vein. Calf Veins: Thrombus extends into the visualized deep calf veins. Superficial Great Saphenous Vein: No evidence of thrombus. Normal compressibility and flow on color Doppler imaging. Venous Reflux:  None. Other Findings:  None. IMPRESSION: Positive for extensive deep venous thrombosis throughout the right lower extremity with nonocclusive thrombus in the common femoral vein, and occlusive thrombus in the femoral vein, profunda femoral vein, popliteal vein and visualized calf veins. Electronically Signed   By: Malachy MoanHeath  McCullough M.D.   On: 11/28/2015 14:42    Microbiology: No results found for this or any previous visit (from the past 240 hour(s)).   Labs: Basic Metabolic Panel:  Recent Labs Lab 11/28/15 1138 11/29/15 0624  NA 136 134*  K 4.7 4.1  CL 103 104  CO2 24 22  GLUCOSE 89 87  BUN 31* 31*  CREATININE 2.28* 1.96*  CALCIUM 9.6 8.9   Liver Function Tests: No results for input(s): AST, ALT, ALKPHOS, BILITOT, PROT, ALBUMIN in the last 168 hours. No results for input(s): LIPASE, AMYLASE in the last 168 hours. No results for input(s): AMMONIA in the last 168 hours. CBC:  Recent Labs Lab 11/28/15 1138 11/29/15 0624  WBC 10.7* 10.6*  HGB 15.4 14.6  HCT 46.6 44.4  MCV 88.4 88.1  PLT 208 216   Cardiac Enzymes:  Recent Labs Lab 11/28/15 1138 11/28/15 2241 11/29/15 0624  11/29/15 1035  TROPONINI 0.06* 0.04* 0.03* 0.03*   BNP: BNP (last 3 results) No results for input(s): BNP in the last 8760 hours.  ProBNP (last 3 results) No results for input(s): PROBNP in the last 8760 hours.  CBG: No results for input(s): GLUCAP in the last 168 hours.     SignedChaya Jan:  HERNANDEZ ACOSTA,Kailyn Vanderslice  [facility] Pager: [phone] 11/29/2015, 1:35 PM

## 2015-11-29 NOTE — Care Management Note (Signed)
Case Management Note  Patient Details  Name: Pollie MeyerHarvey G Reels MRN: [id number] Date of Birth: Jun 02, 1965  Subjective/Objective:                  Pt admitted with DVT/PT. Pt is from home, lives with spouse and ind with ADL's. Pt is employed FT. Pt's spouse at bedside. Pt has Nurse, learning disabilitycommercial insurance and has been started on [employer]. Pt instructed to go to [facility] (they have starter packs) and given voucher from drug company. Pt discharging home today. Pt has PCP for follow up.   Action/Plan: No further CM needs.  Expected Discharge Date:    11/29/2015              Expected Discharge Plan:  Home/Self Care  In-House Referral:  NA  Discharge planning Services  CM Consult, Medication Assistance  Status of Service:     Completed.   Malcolm Metrohildress, Marah Park Demske, RN 11/29/2015, 11:34 AM

## 2015-11-29 NOTE — Progress Notes (Signed)
Discharge instructions and prescriptions given, verbalized understanding, out in stable condition ambulatory with wife. 

## 2015-11-29 NOTE — Progress Notes (Signed)
ANTICOAGULATION CONSULT NOTE -  Pharmacy Consult for Xarelto Indication: pulmonary embolus  No Known Allergies  Patient Measurements: Height: 5\' 7"  (170.2 cm) Weight: 276 lb 9.6 oz (125.5 kg) IBW/kg (Calculated) : 66.1   Vital Signs: Temp: 98.5 F (36.9 C) (11/07 0500) Temp Source: Oral (11/07 0500) BP: 117/66 (11/07 0500) Pulse Rate: 90 (11/07 0500)  Labs:  Recent Labs  11/28/15 1138 11/28/15 2113 11/28/15 2241 11/29/15 0624  HGB 15.4  --   --  14.6  HCT 46.6  --   --  44.4  PLT 208  --   --  216  LABPROT 14.0  --   --  14.4  INR 1.08  --   --  1.11  HEPARINUNFRC  --  0.40  --  0.34  CREATININE 2.28*  --   --  1.96*  TROPONINI 0.06*  --  0.04* 0.03*    Estimated Creatinine Clearance: 58 mL/min (by C-G formula based on SCr of 1.96 mg/dL (H)).   Medical History: Past Medical History:  Diagnosis Date  . CKD (chronic kidney disease) stage 3, GFR 30-59 ml/min   . Hypertension    april 2017    Medications:  Prescriptions Prior to Admission  Medication Sig Dispense Refill Last Dose  . Flaxseed, Linseed, (FLAX SEED OIL PO) Take 1 capsule by mouth daily.   11/27/2015 at Unknown time  . lisinopril-hydrochlorothiazide (PRINZIDE,ZESTORETIC) 20-25 MG tablet Take 0.5 tablets by mouth daily.   0 11/27/2015 at Unknown time    Assessment: 50 yo male presented to the ED with leg pain. Doppler shows an extensive DVT in RLE. VQ scan positive for PE. Heparin infusion began, now transitioning to Xarelto PO.   Goal of Therapy:  Monitor platelets by anticoagulation protocol: Yes   Plan:  Xarelto 15mg  po BID for 21 days, then 20mg  daily Monitor for S/S of bleeding  Elder CyphersLorie Lexxie Winberg, BS Loura BackPharm D, BCPS Clinical Pharmacist Pager [phone] 11/29/2015,11:15 AM

## 2015-11-29 NOTE — Discharge Instructions (Signed)
Use the xarelto starter pack first, then transition to 20 mg once daily.

## 2016-01-19 ENCOUNTER — Other Ambulatory Visit: Payer: Self-pay | Admitting: Nephrology

## 2016-01-19 DIAGNOSIS — N183 Chronic kidney disease, stage 3 unspecified: Secondary | ICD-10-CM

## 2016-01-20 ENCOUNTER — Other Ambulatory Visit: Payer: 59

## 2016-01-20 ENCOUNTER — Ambulatory Visit
Admission: RE | Admit: 2016-01-20 | Discharge: 2016-01-20 | Disposition: A | Discharge status: Home / Self Care | Payer: 59 | Source: Ambulatory Visit | Attending: Nephrology | Admitting: Nephrology

## 2016-01-20 ENCOUNTER — Other Ambulatory Visit: Payer: Self-pay | Admitting: Family Medicine

## 2016-01-20 ENCOUNTER — Ambulatory Visit
Admission: RE | Admit: 2016-01-20 | Discharge: 2016-01-20 | Disposition: A | Discharge status: Home / Self Care | Payer: 59 | Source: Ambulatory Visit | Attending: Family Medicine | Admitting: Family Medicine

## 2016-01-20 DIAGNOSIS — N183 Chronic kidney disease, stage 3 unspecified: Secondary | ICD-10-CM

## 2016-01-20 DIAGNOSIS — R609 Edema, unspecified: Secondary | ICD-10-CM

## 2016-01-20 DIAGNOSIS — I82401 Acute embolism and thrombosis of unspecified deep veins of right lower extremity: Secondary | ICD-10-CM

## 2016-01-27 ENCOUNTER — Ambulatory Visit (INDEPENDENT_AMBULATORY_CARE_PROVIDER_SITE_OTHER): Payer: 59 | Admitting: Vascular Surgery

## 2016-01-27 VITALS — BP 148/93 | HR 90 | Temp 98.1°F | Resp 20 | Ht 67.0 in | Wt 295.0 lb

## 2016-01-27 DIAGNOSIS — I82401 Acute embolism and thrombosis of unspecified deep veins of right lower extremity: Secondary | ICD-10-CM | POA: Diagnosis not present

## 2016-01-27 NOTE — Progress Notes (Signed)
Referred by:  Assunta Found, MD [address] [place], [place] [address]  Reason for referral: residual DVT R leg   History of Present Illness  Ronald Gonzales is a 51 y.o. (January 21, 1966) male who presents with chief complaint: right leg swelling.  Patient notes, onset of swelling in November.  At that point, he had chest pain and dyspnea.  He was diagnosed with an extensive DVT and PE and started on Xarelto. His swelling has improved since then.  He has a vague aching pain in the R leg at this point along with perceived increased swelling in that R leg.  The patient has had no prior history of DVT, no history of varicose vein, no history of venous stasis ulcers, no history of  Lymphedema and no history of skin changes in lower legs.  There is a family history of venous disorders: sister had DVT also.  The patient has not used compression stockings in the past.   Past Medical History:  Diagnosis Date  . CKD (chronic kidney disease) stage 3, GFR 30-59 ml/min   . Hypertension    april 2017    Past Surgical History: none   Social History   Social History  . Marital status: Married    Spouse name: N/A  . Number of children: N/A  . Years of education: N/A   Occupational History  . Acupuncturist    Social History Main Topics  . Smoking status: Never Smoker  . Smokeless tobacco: Never Used  . Alcohol use Yes     Comment: occasional  . Drug use: No  . Sexual activity: Not on file   Other Topics Concern  . Not on file   Social History Narrative  . No narrative on file    Family History  Problem Relation Age of Onset  . CVA Father     Current Outpatient Prescriptions  Medication Sig Dispense Refill  . diltiazem (CARDIZEM) 120 MG tablet Take 120 mg by mouth 4 (four) times daily.    . rivaroxaban (XARELTO) 20 MG TABS tablet Take 1 tablet (20 mg total) by mouth daily with supper. 30 tablet 2  . Flaxseed, Linseed, (FLAX SEED OIL PO) Take 1 capsule by mouth daily.      Marland Kitchen HYDROcodone-acetaminophen (NORCO/VICODIN) 5-325 MG tablet Take 1-2 tablets by mouth every 4 (four) hours as needed for moderate pain. (Patient not taking: Reported on 01/27/2016) 20 tablet 0   No current facility-administered medications for this visit.     No Known Allergies   REVIEW OF SYSTEMS:   Cardiac:  positive for: no symptoms, negative for: Chest pain or chest pressure, Shortness of breath upon exertion and Shortness of breath when lying flat,   Vascular:  positive for: DVT and leg swelling,  negative for: Pain in calf, thigh, or hip brought on by ambulation and Pain in feet at night that wakes you up from your sleep  Pulmonary:  positive for: no symptoms,  negative for: Oxygen at home, Productive cough and Wheezing  Neurologic:  positive for: No symptoms, negative for: Sudden weakness in arms or legs, Sudden numbness in arms or legs, Sudden onset of difficulty speaking or slurred speech, Temporary loss of vision in one eye and Problems with dizziness  Gastrointestinal:  positive for: no symptoms, negative for: Blood in stool and Vomited blood  Genitourinary:  positive for: no symptoms, negative for: Burning when urinating and Blood in urine  Psychiatric:  positive for: no symptoms,  negative for:  Major depression  Hematologic:  positive for: Problems with blood clotting too easily,  negative for: negative for: Bleeding problems  Dermatologic:  positive for: no symptoms, negative for: Rashes or ulcers  Constitutional:  positive for: no symptoms, negative for: Fever or chills  Ear/Nose/Throat:  positive for: no symptoms, negative for: Change in hearing, Nose bleeds and Sore throat  Musculoskeletal:  positive for: no symptoms, negative for: Back pain, Joint pain and Muscle pain   Physical Examination  Vitals:   01/27/16 1528  BP: (!) 148/93  Pulse: 90  Resp: 20  Temp: 98.1 F (36.7 C)  TempSrc: Oral  SpO2: 95%  Weight: 295 lb (133.8 kg)   Height: 5\' 7"  (1.702 m)    Body mass index is 46.2 kg/m.  General: Alert, O x 3, Obese,NAD  Head: [place]/AT,   Ear/Nose/Throat: Hearing grossly intact, nares without erythema or drainage, oropharynx without Erythema or Exudate , Mallampati score: 3, Dentition intact  Eyes: PERRLA, EOMI,   Neck: Supple, mid-line trachea,    Pulmonary: Sym exp, good B air movt,CTA B  Cardiac: RRR, Nl S1, S2, no Murmurs, No rubs, No S3,S4  Vascular: Vessel Right Left  Radial Palpable Palpable  Brachial Palpable Palpable  Carotid Palpable, No Bruit Palpable, No Bruit  Aorta Not palpable N/A  Femoral Palpable Palpable  Popliteal Not palpable Not palpable  PT Faintly palpable Faintly palpable  DP Faintly palpable Faintly palpable   Gastrointestinal: soft, non-distended, non-tender to palpation, No guarding or rebound, no HSM, no masses, no CVAT B, No palpable prominent aortic pulse,    Musculoskeletal: M/S 5/5 throughout  , Extremities without ischemic changes, BLE 2+ edema, both calf symmetric, spider and varicose veins evident in both legs, both legs well perfused  Neurologic: CN 2-12 intact , Pain and light touch intact in extremities , Motor exam as listed above  Psychiatric: Judgement intact, Mood & affect appropriate for pt's clinical situation  Dermatologic: See M/S exam for extremity exam, No rashes otherwise noted  Lymph : Palpable lymph nodes: None   Non-Invasive Vascular Imaging  RLE Venous Duplex (11/28/15) Positive for extensive deep venous thrombosis throughout the right lower extremity with nonocclusive thrombus in the common femoral vein, and occlusive thrombus in the femoral vein, profunda femoral vein, popliteal vein and visualized calf veins.  RLE Venous Duplex (01/20/16) 1. Interval partial clearance of DVT, with residual occlusive thrombus in the femoral vein.   Outside Studies/Documentation 10 pages of outside documents were reviewed including: ED report and  venous duplex reports.   Medical Decision Making  Ronald Gonzales is a 51 y.o. male who presents with: PE, RLE DVT with residual femoral vein thrombus, likely baseline BLE chronic venous insufficiency (C4), possible thrombophilia    Given prior history of PE, I would not consider any intervention on the R femoral vein as the thrombus would have organized already.  I would not compromise this patient's pulmonary function any further as most thrombolytic cases involve a variable degree of embolism to the lung.  Additionally, the patient's popliteal, calf, and common femoral veins have already spontaneous lysed with the Xarelto.  These veins along with the GSV reconstitute a patent drainage venous system on the R at this point.  Based on the patient's history and examination, I recommend: continued anticoagulation, compressive therapy  Repeat R leg venous duplex in 6 months.  At the end of the 6-12 month period of anticoagulation, I would refer the patient to Hematology for a thrombophilia work-up, as this DVT  appears to be unprovoked.  I also recommended OTC compression stockings, as I suspect this patient has baseline CVI given the swelling in the L leg (without DVT).  I suspect he won't tolerate 20-30 mm Hg compression at this point in the R leg.  He will follow up with the above study in 6 months.  Further recommendations to follow that study.  He will likely need a BLE venous insufficiency duplex at some point.  Thank you for allowing [place] to participate in this patient's care.   Leonides Sake, MD [facility] Office: [phone] Pager: [phone]  01/27/2016, 4:48 PM

## 2016-01-31 ENCOUNTER — Other Ambulatory Visit: Payer: Self-pay

## 2016-01-31 DIAGNOSIS — I824Y9 Acute embolism and thrombosis of unspecified deep veins of unspecified proximal lower extremity: Secondary | ICD-10-CM

## 2016-03-26 DIAGNOSIS — Z1389 Encounter for screening for other disorder: Secondary | ICD-10-CM | POA: Diagnosis not present

## 2016-03-26 DIAGNOSIS — S301XXA Contusion of abdominal wall, initial encounter: Secondary | ICD-10-CM | POA: Diagnosis not present

## 2016-03-26 DIAGNOSIS — T148XXA Other injury of unspecified body region, initial encounter: Secondary | ICD-10-CM | POA: Diagnosis not present

## 2016-04-26 DIAGNOSIS — J0100 Acute maxillary sinusitis, unspecified: Secondary | ICD-10-CM | POA: Diagnosis not present

## 2016-05-21 DIAGNOSIS — I10 Essential (primary) hypertension: Secondary | ICD-10-CM | POA: Diagnosis not present

## 2016-05-21 DIAGNOSIS — N183 Chronic kidney disease, stage 3 (moderate): Secondary | ICD-10-CM | POA: Diagnosis not present

## 2016-05-21 DIAGNOSIS — I82409 Acute embolism and thrombosis of unspecified deep veins of unspecified lower extremity: Secondary | ICD-10-CM | POA: Diagnosis not present

## 2016-08-01 ENCOUNTER — Encounter: Payer: Self-pay | Admitting: Vascular Surgery

## 2016-08-06 NOTE — Progress Notes (Signed)
    Established Venous Insufficiency   History of Present Illness   Ronald Gonzales is a 51 y.o. ([birth year]) male who presents with chief complaint: continued R>L leg swelling.  The patient returns for interval evaluation after 6 months of Xarelto.  The patient's symptoms have not progressed.  The patient's symptoms are: swelling in R>L leg.  The patient is not compliant with compression stockings.  The patient's PMH, PSH, SH, and FamHx are unchanged from 01/27/16.  Current Outpatient Prescriptions  Medication Sig Dispense Refill  . diltiazem (CARDIZEM) 120 MG tablet Take 120 mg by mouth 4 (four) times daily.    . Flaxseed, Linseed, (FLAX SEED OIL PO) Take 1 capsule by mouth daily.    Marland Kitchen. HYDROcodone-acetaminophen (NORCO/VICODIN) 5-325 MG tablet Take 1-2 tablets by mouth every 4 (four) hours as needed for moderate pain. (Patient not taking: Reported on 01/27/2016) 20 tablet 0  . rivaroxaban (XARELTO) 20 MG TABS tablet Take 1 tablet (20 mg total) by mouth daily with supper. 30 tablet 2   No current facility-administered medications for this visit.    No Known Allergies  On ROS today: continued R>L swelling, no bleeding complications from Xarelto   Physical Examination   Vitals:   08/10/16 1056  BP: 135/84  Pulse: 78  Resp: 18  Temp: 97.6 F (36.4 C)  TempSrc: Oral  SpO2: 96%  Weight: (!) 301 lb 8 oz (136.8 kg)  Height: 5\' 7"  (1.702 m)   Body mass index is 47.22 kg/m.  General Alert, O x 3, Obese, NAD  Pulmonary Sym exp, good B air movt, CTA B  Cardiac RRR, Nl S1, S2, no Murmurs, No rubs, No S3,S4  Vascular Vessel Right Left  Radial Palpable Palpable  Brachial Palpable Palpable  Carotid Palpable, No Bruit Palpable, No Bruit  Aorta Not palpable N/A  Femoral Palpable Palpable  Popliteal Not palpable Not palpable  PT Palpable Palpable  DP Palpable Palpable    Gastro- intestinal soft, non-distended, non-tender to palpation, No guarding or rebound, no HSM, no masses, no CVAT  B, No palpable prominent aortic pulse,    Musculo- skeletal M/S 5/5 throughout  , Extremities without ischemic changes  , Pitting edema present: R, LLE edema 1-2+, Varicosities present: R>L, Lipodermatosclerosis present: mild R  Neurologic Cranial nerves 2-12 intact , Pain and light touch intact in extremities , Motor exam as listed above     Non-Invasive Vascular Imaging   RLE Venous Duplex (08/10/2016):   RLE:   No DVT and SVT except chronic DVT in bifid femoral vein   Medical Decision Making   Ronald Gonzales is a 51 y.o. male who presents with:  PE, RLE DVT with residual femoral vein thrombus, likely baseline BLE chronic venous insufficiency (C4), possible thrombophilia    Ok to D/C Xarelto at this point.  Will refer pt to Heme for thrombophilia work-up.  Based on the patient's vascular studies and examination, I have offered the patient: BLE venous reflux duplex in 4-6 weeks.  Recommended: restart compression stockings.  Thank you for allowing us to participate in this patient's care.   Leonides SakeBrian Chen, MD, FACS [facility] Office: [phone] Pager: [phone]

## 2016-08-10 ENCOUNTER — Ambulatory Visit (INDEPENDENT_AMBULATORY_CARE_PROVIDER_SITE_OTHER): Payer: 59 | Admitting: Vascular Surgery

## 2016-08-10 ENCOUNTER — Encounter: Payer: Self-pay | Admitting: Vascular Surgery

## 2016-08-10 ENCOUNTER — Ambulatory Visit (HOSPITAL_COMMUNITY)
Admission: RE | Admit: 2016-08-10 | Discharge: 2016-08-10 | Disposition: A | Discharge status: Home / Self Care | Payer: 59 | Source: Ambulatory Visit | Attending: Vascular Surgery | Admitting: Vascular Surgery

## 2016-08-10 VITALS — BP 135/84 | HR 78 | Temp 97.6°F | Resp 18 | Ht 67.0 in | Wt 301.5 lb

## 2016-08-10 DIAGNOSIS — I82401 Acute embolism and thrombosis of unspecified deep veins of right lower extremity: Secondary | ICD-10-CM | POA: Diagnosis not present

## 2016-08-10 DIAGNOSIS — I824Y9 Acute embolism and thrombosis of unspecified deep veins of unspecified proximal lower extremity: Secondary | ICD-10-CM

## 2016-08-13 NOTE — Addendum Note (Signed)
Addended by: Burton ApleyPETTY, Yamile Roedl A on: 08/13/2016 02:39 PM   Modules accepted: Orders

## 2016-08-15 DIAGNOSIS — N183 Chronic kidney disease, stage 3 (moderate): Secondary | ICD-10-CM | POA: Diagnosis not present

## 2016-08-15 DIAGNOSIS — I10 Essential (primary) hypertension: Secondary | ICD-10-CM | POA: Diagnosis not present

## 2016-08-15 DIAGNOSIS — I82409 Acute embolism and thrombosis of unspecified deep veins of unspecified lower extremity: Secondary | ICD-10-CM | POA: Diagnosis not present

## 2016-08-16 DIAGNOSIS — N2581 Secondary hyperparathyroidism of renal origin: Secondary | ICD-10-CM | POA: Diagnosis not present

## 2016-08-22 ENCOUNTER — Other Ambulatory Visit (HOSPITAL_COMMUNITY): Payer: Self-pay | Admitting: Nephrology

## 2016-08-22 DIAGNOSIS — I10 Essential (primary) hypertension: Secondary | ICD-10-CM

## 2016-08-23 ENCOUNTER — Other Ambulatory Visit: Payer: Self-pay | Admitting: *Deleted

## 2016-08-23 DIAGNOSIS — I824Z9 Acute embolism and thrombosis of unspecified deep veins of unspecified distal lower extremity: Secondary | ICD-10-CM

## 2016-08-29 ENCOUNTER — Other Ambulatory Visit: Payer: Self-pay | Admitting: *Deleted

## 2016-08-29 ENCOUNTER — Telehealth: Payer: Self-pay | Admitting: Vascular Surgery

## 2016-08-29 ENCOUNTER — Other Ambulatory Visit: Payer: Self-pay

## 2016-08-29 DIAGNOSIS — I824Z9 Acute embolism and thrombosis of unspecified deep veins of unspecified distal lower extremity: Secondary | ICD-10-CM

## 2016-08-29 DIAGNOSIS — N183 Chronic kidney disease, stage 3 (moderate): Secondary | ICD-10-CM | POA: Diagnosis not present

## 2016-08-29 NOTE — Telephone Encounter (Signed)
-----   Message from Sharee PimpleMarilyn K McChesney, RN sent at 08/23/2016  8:52 AM EDT ----- Regarding: FW: referral reasoning I have put this order in EPIC and I left a message on the patient's mobile phone to call me back to discuss.   ----- Message ----- From: Fransisco Hertzhen, Brian L, MD Sent: 08/23/2016   7:02 AM To: Sharee PimpleMarilyn K McChesney, RN Subject: RE: referral reasoning                         Thrombophilia panel (might be under hypercoag).  Will review.  If abnormal will refer the patient again as we don't primarily manage thrombophilia.  ----- Message ----- From: Sharee PimpleMcChesney, Marilyn K, RN Sent: 08/22/2016   4:36 PM To: Fransisco HertzBrian L Chen, MD, Jena GaussMichele A Roczniak Subject: FW: referral reasoning                         Can you weigh in on this, Hematology is not wanting to work this guy up for thrombophilia as you requested. Do you want us to order some blood work for him?  ----- Message ----- From: Jena Gaussoczniak, Michele A Sent: 08/22/2016   4:09 PM To: Sharee PimpleMarilyn K McChesney, RN Subject: RE: referral reasoning                         Yes, this is what we sent them and that I read with them over the phone. The said it was not sufficient for them to see the patient.   ----- Message ----- From: Sharee PimpleMcChesney, Marilyn K, RN Sent: 08/22/2016   3:37 PM To: Sallyanne KusterMichele A Roczniak Subject: RE: referral reasoning                         He has not been diagnosed with thrombophilia, he needs to be worked up to rule out thrombophilia per Dr. Imogene Burnhen:  Progress Notes Encounter Date: 08/10/2016 Musculo- skeletal M/S 5/5 throughout  , Extremities without ischemic changes  , Pitting edema present: R, LLE edema 1-2+, Varicosities present: R>L, Lipodermatosclerosis present: mild R  Medical Decision Making  Pollie MeyerHarvey G Ogando is a 51 y.o. male who presents with:  PE, RLE DVT with residual femoral vein thrombus, likely baseline BLE chronic venous insufficiency (C4), possible thrombophilia    Ok to D/C Xarelto at this point.  Will refer pt to  Heme for thrombophilia work-up.     ----- Message ----- From: Jena Gaussoczniak, Michele A Sent: 08/22/2016   3:17 PM To: Sharee PimpleMarilyn K McChesney, RN Subject: referral reasoning                             [facility] [phone]. We referred this patient there for thrombophilia; however, they said they they are not able to schedule the patient because there is nothing in the notes indication why the physician thinks the patient has thrombophilia.  They said the patient has not had labs since November of 2017 and the progress notes we sent over are not sufficient.  What should I tell them?

## 2016-09-12 ENCOUNTER — Other Ambulatory Visit (HOSPITAL_COMMUNITY): Payer: 59

## 2016-09-12 ENCOUNTER — Encounter (HOSPITAL_COMMUNITY): Payer: 59 | Attending: Oncology | Admitting: Oncology

## 2016-09-12 ENCOUNTER — Encounter (HOSPITAL_COMMUNITY): Payer: Self-pay | Admitting: Oncology

## 2016-09-12 VITALS — BP 146/86 | HR 82 | Resp 18 | Ht 71.0 in | Wt 304.6 lb

## 2016-09-12 DIAGNOSIS — Z86718 Personal history of other venous thrombosis and embolism: Secondary | ICD-10-CM

## 2016-09-12 DIAGNOSIS — I825Z1 Chronic embolism and thrombosis of unspecified deep veins of right distal lower extremity: Secondary | ICD-10-CM

## 2016-09-12 NOTE — Progress Notes (Signed)
[facility] Initial Visit:  Patient Care Team: Sharilyn Sites, MD as PCP - General (Family Medicine)  CHIEF COMPLAINTS/PURPOSE OF CONSULTATION:  RLE DVT  HISTORY OF PRESENTING ILLNESS: Ronald Gonzales 51 y.o. male presents for thrombophilia workup. Patient was having right leg pain and swelling in November 2017. He denies any prior immobility, trauma, prolonged travel. He had Doppler venous ultrasound of right lower extremity on 11/28/15 which demonstrated extensive deep venous thrombosis throughout the right lower extremity with nonocclusive thrombus in the common femoral vein, and occlusive thrombus in the femoral vein, profunda femoral vein, popliteal vein and visualized calf veins. He was placed on Xarelto for anticoagulation. Repeat Doppler venous ultrasound on 01/20/16 demonstrated interval partial clearance of DVT, with residual occlusive thrombus in the femoral vein. Patient has been seeing vascular surgery, Dr. Adele Barthel, and had a right lower extremity venous duplex performed on 08/10/16 which demonstrated no DVT and SVT except chronic DVT in bifid femoral vein. He thought that the patient most likely has baseline BLE chronic venous insufficiency. Xarelto was discontinued after this visit. He was recommended to restart compression stockings however the patient has not been wearing them because he thinks that they have not been helpful. He still has intermittent swelling in his right lower extremity.   Family history is positive for a sister who had a DVT in her calf after a prolonged plane flights but otherwise no one else has had any thrombophilia.  Hypercoagulable workup ordered on 08/29/16 demonstrated homocysteine 17.7, factor VIII activity level 206, Antithrombin activity level 105, protein C antigen 100, protein S antigen 98, factor VIII antigen 141, anticardiolipin antibodies and beta 2 glycoprotein antibodies were within normal limits, lupus anticoagulant was negative.  No prothrombin G20210A mutation was present.  Review of Systems  Constitutional: Negative for appetite change, chills, fatigue and fever.  HENT:   Negative for hearing loss, lump/mass, mouth sores, sore throat and tinnitus.   Eyes: Negative for eye problems and icterus.  Respiratory: Negative for chest tightness, cough, hemoptysis, shortness of breath and wheezing.   Cardiovascular: Positive for leg swelling (RLE). Negative for chest pain and palpitations.  Gastrointestinal: Negative for abdominal distention, abdominal pain, blood in stool, diarrhea, nausea and vomiting.  Endocrine: Negative.  Negative for hot flashes.  Genitourinary: Negative for difficulty urinating, frequency and hematuria.   Musculoskeletal: Negative for arthralgias and neck pain.  Skin: Negative for itching and rash.  Neurological: Negative for dizziness, headaches and speech difficulty.  Hematological: Negative for adenopathy. Does not bruise/bleed easily.  Psychiatric/Behavioral: Negative for confusion. The patient is not nervous/anxious.     MEDICAL HISTORY: Past Medical History:  Diagnosis Date  . CKD (chronic kidney disease) stage 3, GFR 30-59 ml/min   . Hypertension    april 2017    SURGICAL HISTORY: History reviewed. No pertinent surgical history.  SOCIAL HISTORY: Social History   Social History  . Marital status: Married    Spouse name: N/A  . Number of children: N/A  . Years of education: N/A   Occupational History  . Art gallery manager    Social History Main Topics  . Smoking status: Never Smoker  . Smokeless tobacco: Never Used  . Alcohol use Yes     Comment: occasional  . Drug use: No  . Sexual activity: Not on file   Other Topics Concern  . Not on file   Social History Narrative  . No narrative on file    FAMILY HISTORY Family History  Problem Relation  Age of Onset  . CVA Father     ALLERGIES:  has No Known Allergies.  MEDICATIONS:  Current Outpatient Prescriptions   Medication Sig Dispense Refill  . losartan (COZAAR) 25 MG tablet Take 25 mg by mouth daily.    . Flaxseed, Linseed, (FLAX SEED OIL PO) Take 1 capsule by mouth daily.    Marland Kitchen HYDROcodone-acetaminophen (NORCO/VICODIN) 5-325 MG tablet Take 1-2 tablets by mouth every 4 (four) hours as needed for moderate pain. (Patient not taking: Reported on 01/27/2016) 20 tablet 0   No current facility-administered medications for this visit.     PHYSICAL EXAMINATION:  ECOG PERFORMANCE STATUS: 1 - Symptomatic but completely ambulatory   Vitals:   09/12/16 0848  BP: (!) 146/86  Pulse: 82  Resp: 18  SpO2: 96%    Filed Weights   09/12/16 0848  Weight: (!) 304 lb 9.6 oz (138.2 kg)     Physical Exam  Constitutional: He is oriented to person, place, and time and well-developed, well-nourished, and in no distress. No distress.  HENT:  Head: Normocephalic and atraumatic.  Mouth/Throat: No oropharyngeal exudate.  Eyes: Pupils are equal, round, and reactive to light. Conjunctivae are normal. No scleral icterus.  Neck: Normal range of motion. Neck supple. No JVD present.  Cardiovascular: Normal rate, regular rhythm and normal heart sounds.  Exam reveals no gallop and no friction rub.   No murmur heard. Pulmonary/Chest: Breath sounds normal. No respiratory distress. He has no wheezes. He has no rales.  Abdominal: Soft. Bowel sounds are normal. He exhibits no distension. There is no tenderness. There is no guarding.  Musculoskeletal: He exhibits edema (RLE>LLE). He exhibits no tenderness.  Lymphadenopathy:    He has no cervical adenopathy.  Neurological: He is alert and oriented to person, place, and time. No cranial nerve deficit.  Skin: Skin is warm and dry. No rash noted. No erythema. No pallor.  Psychiatric: Affect and judgment normal.     LABORATORY DATA: I have personally reviewed the data as listed:  No visits with results within 1 Month(s) from this visit.  Latest known visit with results is:   Admission on 11/28/2015, Discharged on 11/29/2015  Component Date Value Ref Range Status  . Sodium 11/28/2015 136  135 - 145 mmol/L Final  . Potassium 11/28/2015 4.7  3.5 - 5.1 mmol/L Final  . Chloride 11/28/2015 103  101 - 111 mmol/L Final  . CO2 11/28/2015 24  22 - 32 mmol/L Final  . Glucose, Bld 11/28/2015 89  65 - 99 mg/dL Final  . BUN 11/28/2015 31* 6 - 20 mg/dL Final  . Creatinine, Ser 11/28/2015 2.28* 0.61 - 1.24 mg/dL Final  . Calcium 11/28/2015 9.6  8.9 - 10.3 mg/dL Final  . GFR calc non Af Amer 11/28/2015 32* >60 mL/min Final  . GFR calc Af Amer 11/28/2015 37* >60 mL/min Final   Comment: (NOTE) The eGFR has been calculated using the CKD EPI equation. This calculation has not been validated in all clinical situations. eGFR's persistently <60 mL/min signify possible Chronic Kidney Disease.   . Anion gap 11/28/2015 9  5 - 15 Final  . WBC 11/28/2015 10.7* 4.0 - 10.5 K/uL Final  . RBC 11/28/2015 5.27  4.22 - 5.81 MIL/uL Final  . Hemoglobin 11/28/2015 15.4  13.0 - 17.0 g/dL Final  . HCT 11/28/2015 46.6  39.0 - 52.0 % Final  . MCV 11/28/2015 88.4  78.0 - 100.0 fL Final  . MCH 11/28/2015 29.2  26.0 - 34.0 pg Final  .  MCHC 11/28/2015 33.0  30.0 - 36.0 g/dL Final  . RDW 11/28/2015 12.8  11.5 - 15.5 % Final  . Platelets 11/28/2015 208  150 - 400 K/uL Final  . Troponin I 11/28/2015 0.06* <0.03 ng/mL Final   Comment: CRITICAL RESULT CALLED TO, READ BACK BY AND VERIFIED WITH: ROBINSON,L AT 12:25PM ON 11/28/15 BY FESTERMAN,C   . D-Dimer, Quant 11/28/2015 13.16* 0.00 - 0.50 ug/mL-FEU Final   Comment: (NOTE) At the manufacturer cut-off of 0.50 ug/mL FEU, this assay has been documented to exclude PE with a sensitivity and negative predictive value of 97 to 99%.  At this time, this assay has not been approved by the FDA to exclude DVT/VTE. Results should be correlated with clinical presentation.   . Prothrombin Time 11/28/2015 14.0  11.4 - 15.2 seconds Final  . INR 11/28/2015 1.08    Final  . Heparin Unfractionated 11/28/2015 0.40  0.30 - 0.70 IU/mL Final   Comment:        IF HEPARIN RESULTS ARE BELOW EXPECTED VALUES, AND PATIENT DOSAGE HAS BEEN CONFIRMED, SUGGEST FOLLOW UP TESTING OF ANTITHROMBIN III LEVELS.   Marland Kitchen Heparin Unfractionated 11/29/2015 0.34  0.30 - 0.70 IU/mL Final   Comment:        IF HEPARIN RESULTS ARE BELOW EXPECTED VALUES, AND PATIENT DOSAGE HAS BEEN CONFIRMED, SUGGEST FOLLOW UP TESTING OF ANTITHROMBIN III LEVELS.   . WBC 11/29/2015 10.6* 4.0 - 10.5 K/uL Final  . RBC 11/29/2015 5.04  4.22 - 5.81 MIL/uL Final  . Hemoglobin 11/29/2015 14.6  13.0 - 17.0 g/dL Final  . HCT 11/29/2015 44.4  39.0 - 52.0 % Final  . MCV 11/29/2015 88.1  78.0 - 100.0 fL Final  . MCH 11/29/2015 29.0  26.0 - 34.0 pg Final  . MCHC 11/29/2015 32.9  30.0 - 36.0 g/dL Final  . RDW 11/29/2015 12.9  11.5 - 15.5 % Final  . Platelets 11/29/2015 216  150 - 400 K/uL Final  . Prothrombin Time 11/29/2015 14.4  11.4 - 15.2 seconds Final  . INR 11/29/2015 1.11   Final  . Troponin I 11/28/2015 0.04* <0.03 ng/mL Final   CRITICAL VALUE NOTED.  VALUE IS CONSISTENT WITH PREVIOUSLY REPORTED AND CALLED VALUE.  . Troponin I 11/29/2015 0.03* <0.03 ng/mL Final   CRITICAL VALUE NOTED.  VALUE IS CONSISTENT WITH PREVIOUSLY REPORTED AND CALLED VALUE.  . Troponin I 11/29/2015 0.03* <0.03 ng/mL Final   CRITICAL VALUE NOTED.  VALUE IS CONSISTENT WITH PREVIOUSLY REPORTED AND CALLED VALUE.  Marland Kitchen Sodium 11/29/2015 134* 135 - 145 mmol/L Final  . Potassium 11/29/2015 4.1  3.5 - 5.1 mmol/L Final  . Chloride 11/29/2015 104  101 - 111 mmol/L Final  . CO2 11/29/2015 22  22 - 32 mmol/L Final  . Glucose, Bld 11/29/2015 87  65 - 99 mg/dL Final  . BUN 11/29/2015 31* 6 - 20 mg/dL Final  . Creatinine, Ser 11/29/2015 1.96* 0.61 - 1.24 mg/dL Final  . Calcium 11/29/2015 8.9  8.9 - 10.3 mg/dL Final  . GFR calc non Af Amer 11/29/2015 38* >60 mL/min Final  . GFR calc Af Amer 11/29/2015 44* >60 mL/min Final   Comment:  (NOTE) The eGFR has been calculated using the CKD EPI equation. This calculation has not been validated in all clinical situations. eGFR's persistently <60 mL/min signify possible Chronic Kidney Disease.   . Anion gap 11/29/2015 8  5 - 15 Final    RADIOGRAPHIC STUDIES: I have personally reviewed the radiological images as listed and agree with the  findings in the report  No results found.  ASSESSMENT: Unprovoked RLE DVT diagnosed in November 2017, treated with xarelto for 9 months.  Patient has been following with vascular surgery who has performed a venous duplex on 08/10/16 which demonstrated no DVT and SVT except chronic DVT in bifid femoral vein. Chronic venous insufficiency  PLAN: -Reviewed hypercoagulable workup with the patient, the only significant finding is an elevated Factor VIII activity level and elevated homocysteine. I have given him a copy of his labs. -High factor VIII (FVIII) levels are known to be a risk factor for deep venous thrombosis, but the mechanisms responsible for high FVIII levels remain unclear. Patients with elevated Factor VIII may have a 5 fold risk for venous thrombosis, and some studies have shown a 30% chance of getting recurrent thrombosis if a patient has an elevated Factor VIII activity level. I have discussed this risk and potential lifelong anticoagulation with the patient however he would like to take the risk and stay off blood thinners at this time. I have told him that should he get a recurrent DVT, I would recommend lifelong anticoagulation and he verbalized understanding. -As for the elevated homocysteine, the studies have been controversial in whether replacement with folic acid 1 mg PO daily is helpful in preventing thrombosis. I have told the patient he may take a folic acid 1 mg PO daily to improve his hyperhomocysteinemia.  -The only lab that was not drawn during the hypercoagulable workup was the Factor V Leiden gene mutation, which is the  most common inherited gene mutation for thrombophilia. Therefore we will get it drawn today and I will call him with the results. -He is to follow up with Dr. Bridgett Larsson, his vascular surgeon, for procedures to correct his venous insufficiency. -RTC PRN. However should he get another blood clot in the future, I have told the patient to come back to see me.  All questions were answered. The patient knows to call the clinic with any problems, questions or concerns.  This note was electronically signed.    Twana First, MD  09/12/2016 9:05 AM

## 2016-09-17 ENCOUNTER — Ambulatory Visit (HOSPITAL_COMMUNITY): Payer: 59

## 2016-09-19 ENCOUNTER — Other Ambulatory Visit: Payer: Self-pay | Admitting: Radiology

## 2016-09-19 ENCOUNTER — Encounter: Payer: Self-pay | Admitting: Vascular Surgery

## 2016-09-20 ENCOUNTER — Other Ambulatory Visit: Payer: Self-pay | Admitting: General Surgery

## 2016-09-21 ENCOUNTER — Ambulatory Visit (HOSPITAL_COMMUNITY)
Admission: RE | Admit: 2016-09-21 | Discharge: 2016-09-21 | Disposition: A | Discharge status: Home / Self Care | Payer: 59 | Source: Ambulatory Visit | Attending: Nephrology | Admitting: Nephrology

## 2016-09-21 ENCOUNTER — Encounter (HOSPITAL_COMMUNITY): Payer: Self-pay

## 2016-09-21 DIAGNOSIS — N183 Chronic kidney disease, stage 3 (moderate): Secondary | ICD-10-CM | POA: Insufficient documentation

## 2016-09-21 DIAGNOSIS — I129 Hypertensive chronic kidney disease with stage 1 through stage 4 chronic kidney disease, or unspecified chronic kidney disease: Secondary | ICD-10-CM | POA: Insufficient documentation

## 2016-09-21 DIAGNOSIS — I10 Essential (primary) hypertension: Secondary | ICD-10-CM

## 2016-09-21 DIAGNOSIS — N289 Disorder of kidney and ureter, unspecified: Secondary | ICD-10-CM | POA: Diagnosis not present

## 2016-09-21 HISTORY — PX: RENAL BIOPSY: SHX156

## 2016-09-21 LAB — CBC
HCT: 44.5 % (ref 39.0–52.0)
Hemoglobin: 14.4 g/dL (ref 13.0–17.0)
MCH: 28.6 pg (ref 26.0–34.0)
MCHC: 32.4 g/dL (ref 30.0–36.0)
MCV: 88.3 fL (ref 78.0–100.0)
PLATELETS: 249 10*3/uL (ref 150–400)
RBC: 5.04 MIL/uL (ref 4.22–5.81)
RDW: 13.3 % (ref 11.5–15.5)
WBC: 7.6 10*3/uL (ref 4.0–10.5)

## 2016-09-21 LAB — PROTIME-INR
INR: 0.97
Prothrombin Time: 12.8 seconds (ref 11.4–15.2)

## 2016-09-21 LAB — APTT: APTT: 27 s (ref 24–36)

## 2016-09-21 MED ORDER — LIDOCAINE HCL (PF) 1 % IJ SOLN
INTRAMUSCULAR | Status: AC
  Filled 2016-09-21: qty 30

## 2016-09-21 MED ORDER — GELATIN ABSORBABLE 12-7 MM EX MISC
CUTANEOUS | Status: AC
  Filled 2016-09-21: qty 1

## 2016-09-21 MED ORDER — MIDAZOLAM HCL 2 MG/2ML IJ SOLN
INTRAMUSCULAR | Status: AC
  Filled 2016-09-21: qty 2

## 2016-09-21 MED ORDER — MIDAZOLAM HCL 2 MG/2ML IJ SOLN
INTRAMUSCULAR | Status: AC | PRN
  Administered 2016-09-21 (×2): 1 mg via INTRAVENOUS

## 2016-09-21 MED ORDER — FENTANYL CITRATE (PF) 100 MCG/2ML IJ SOLN
INTRAMUSCULAR | Status: AC
  Filled 2016-09-21: qty 2

## 2016-09-21 MED ORDER — HYDROCODONE-ACETAMINOPHEN 5-325 MG PO TABS: 1.0000 | ORAL_TABLET | ORAL | Status: DC | PRN

## 2016-09-21 MED ORDER — FENTANYL CITRATE (PF) 100 MCG/2ML IJ SOLN
INTRAMUSCULAR | Status: AC | PRN
  Administered 2016-09-21 (×2): 50 ug via INTRAVENOUS

## 2016-09-21 MED ORDER — SODIUM CHLORIDE 0.9 % IV SOLN: INTRAVENOUS | Status: DC

## 2016-09-21 MED ORDER — SODIUM CHLORIDE 0.9 % IV SOLN
INTRAVENOUS | Status: AC | PRN
  Administered 2016-09-21: 10 mL/h via INTRAVENOUS

## 2016-09-21 NOTE — Sedation Documentation (Signed)
Patient denies pain and is resting comfortably.  

## 2016-09-21 NOTE — Discharge Instructions (Addendum)

## 2016-09-21 NOTE — Sedation Documentation (Signed)
Patient is resting comfortably. 

## 2016-09-21 NOTE — Procedures (Signed)
  Procedure:   LLP renal core bx with US 16g x2 Preprocedure diagnosis:  Renal insufficiency Postprocedure diagnosis:  same EBL:     minimal Complications:   none immediate  See full dictation in [facility] PACS.  Thora Lance. Ellajane Stong MD Main # [phone] Pager  [phone]

## 2016-09-21 NOTE — H&P (Signed)
Chief Complaint: Patient was seen in consultation today for random renal biopsy  Referring Physician(s): Upton,Elizabeth  Supervising Physician: Oley BalmHassell, Daniel  [facility]  History of Present Illness: Ronald Gonzales is a 51 y.o. male with past medical history of hypertension and chronic kidney disease presented with progressive renal disease.   IR consulted for random renal biopsy at the request of Dr. Signe ColtUpton.  Patient presents today in his usual state of health.  He has been NPO.  He does not take blood thinners.   Past Medical History:  Diagnosis Date  . CKD (chronic kidney disease) stage 3, GFR 30-59 ml/min   . Hypertension    april 2017    History reviewed. No pertinent surgical history.  Allergies: Patient has no known allergies.  Medications: Prior to Admission medications   Medication Sig Start Date End Date Taking? Authorizing Provider  losartan (COZAAR) 25 MG tablet Take 25 mg by mouth daily.   Yes [provider]  Flaxseed, Linseed, (FLAX SEED OIL PO) Take 1 capsule by mouth daily.    [provider]     Family History  Problem Relation Age of Onset  . CVA Father     Social History   Social History  . Marital status: Married    Spouse name: N/A  . Number of children: N/A  . Years of education: N/A   Occupational History  . Acupuncturistelectrical engineer    Social History Main Topics  . Smoking status: Never Smoker  . Smokeless tobacco: Never Used  . Alcohol use Yes     Comment: occasional  . Drug use: No  . Sexual activity: Not Asked   Other Topics Concern  . None   Social History Narrative  . None    Review of Systems  Constitutional: Negative for fatigue and fever.  Respiratory: Negative for cough and shortness of breath.   Cardiovascular: Negative for chest pain.  Musculoskeletal: Negative for back pain.  Psychiatric/Behavioral: Negative for behavioral problems and confusion.    Vital Signs: BP (!)  144/96   Pulse 77   Temp 97.8 F (36.6 C) (Oral)   Resp 18   Ht 5\' 11"  (1.803 m)   Wt 300 lb (136.1 kg)   SpO2 96%   BMI 41.84 kg/m   Physical Exam  Constitutional: He is oriented to person, place, and time. He appears well-developed.  Cardiovascular: Normal rate, regular rhythm and normal heart sounds.   Pulmonary/Chest: Effort normal and breath sounds normal.  Abdominal: Soft.  Neurological: He is alert and oriented to person, place, and time.  Skin: Skin is warm and dry.  Psychiatric: He has a normal mood and affect. His behavior is normal. Judgment and thought content normal.  Nursing note and vitals reviewed.   Mallampati Score:  MD Evaluation Airway: WNL Heart: WNL Abdomen: WNL Chest/ Lungs: WNL ASA  Classification: 3 Mallampati/Airway Score: Two  Imaging: No results found.  Labs:  CBC:  Recent Labs  11/28/15 1138 11/29/15 0624 09/21/16 0635  WBC 10.7* 10.6* 7.6  HGB 15.4 14.6 14.4  HCT 46.6 44.4 44.5  PLT 208 216 249    COAGS:  Recent Labs  11/28/15 1138 11/29/15 0624 09/21/16 0635  INR 1.08 1.11 0.97  APTT  --   --  27    BMP:  Recent Labs  11/28/15 1138 11/29/15 0624  NA 136 134*  K 4.7 4.1  CL 103 104  CO2 24 22  GLUCOSE 89 87  BUN 31* 31*  CALCIUM 9.6 8.9  CREATININE 2.28* 1.96*  GFRNONAA 32* 38*  GFRAA 37* 44*    LIVER FUNCTION TESTS: No results for input(s): BILITOT, AST, ALT, ALKPHOS, PROT, ALBUMIN in the last 8760 hours.  TUMOR MARKERS: No results for input(s): AFPTM, CEA, CA199, CHROMGRNA in the last 8760 hours.  Assessment and Plan: Patient with past medical history of chronic kidney disease, hypertension presents with complaint of progressive renal disease.  IR consulted for random kidney biopsy at the request of Dr. Signe Colt. Patient presents today in their usual state of health.  He has been NPO and is not currently on blood thinners.  Risks and benefits discussed with the patient including, but not limited to  bleeding, infection, damage to adjacent structures or low yield requiring additional tests. All of the patient's questions were answered, patient is agreeable to proceed. Consent signed and in chart.  Thank you for this interesting consult.  I greatly enjoyed meeting Ronald Gonzales and look forward to participating in their care.  A copy of this report was sent to the requesting provider on this date.  Electronically Signed: Hoyt Koch, PA 09/21/2016, 7:45 AM   I spent a total of  30 Minutes   in face to face in clinical consultation, greater than 50% of which was counseling/coordinating care for hypertension.

## 2016-09-25 NOTE — Progress Notes (Deleted)
Established Venous Insufficiency   History of Present Illness   Ronald Gonzales is a 51 y.o. (May 06, 1965) male who presents with chief complaint: RLE DVT.  Pt was referred to Heme: elevated Factor VIII.  Factor V Leiden was also drawn.  The patient elected to stop take anticoagulation.  The patient returns to do to get studies of possible valvular incompetence.  ***The patient's symptoms have *** progressed.  The patient's symptoms are: ***.  The patient is ***compliant with compression stockings.  The patient's PMH, PSH, SH, and FamHx are unchanged from ***.  Current Outpatient Prescriptions  Medication Sig Dispense Refill  . Flaxseed, Linseed, (FLAX SEED OIL PO) Take 1 capsule by mouth daily.    Marland Kitchen. losartan (COZAAR) 25 MG tablet Take 25 mg by mouth daily.     No current facility-administered medications for this visit.     No Known Allergies  On ROS today: ***, ***   Physical Examination  ***There were no vitals filed for this visit. ***There is no height or weight on file to calculate BMI.  General {LOC:19197::"Somulent","Alert"}, {Orientation:19197::"Confused","O x 3"}, {Weight:19197::"Obese","Cachectic","WD"}, {General state of health:19197::"Ill appearing","Elderly","NAD"}  Pulmonary {Chest wall:19197::"Asx chest movement","Sym exp"}, {Air movt:19197::"Decreased *** air movt","good B air movt"}, {BS:19197::"rales on ***","rhonchi on ***","wheezing on ***","CTA B"}  Cardiac {Rhythm:19197::"Irregularly, irregular rate and rhythm","RRR, Nl S1, S2"}, {Murmur:19197::"Murmur present: ***","no Murmurs"}, {Rubs:19197::"Rub present: ***","No rubs"}, {Gallop:19197::"Gallop present: ***","No S3,S4"}  Vascular Vessel Right Left  Radial {Palpable:19197::"Not palpable","Faintly palpable","Palpable"} {Palpable:19197::"Not palpable","Faintly palpable","Palpable"}  Brachial {Palpable:19197::"Not palpable","Faintly palpable","Palpable"} {Palpable:19197::"Not palpable","Faintly  palpable","Palpable"}  Carotid Palpable, {Bruit:19197::"Bruit present","No Bruit"} Palpable, {Bruit:19197::"Bruit present","No Bruit"}  Aorta Not palpable N/A  Femoral {Palpable:19197::"Not palpable","Faintly palpable","Palpable"} {Palpable:19197::"Not palpable","Faintly palpable","Palpable"}  Popliteal {Palpable:19197::"Prominently palpable","Not palpable"} {Palpable:19197::"Prominently palpable","Not palpable"}  PT {Palpable:19197::"Not palpable","Faintly palpable","Palpable"} {Palpable:19197::"Not palpable","Faintly palpable","Palpable"}  DP {Palpable:19197::"Not palpable","Faintly palpable","Palpable"} {Palpable:19197::"Not palpable","Faintly palpable","Palpable"}    Gastro- intestinal soft, {Distension:19197::"distended","non-distended"}, {TTP:19197::"TTP in *** quadrant","appropriate tenderness to palpation","non-tender to palpation"}, {Guarding:19197::"Guarding and rebound in *** quadrant","No guarding or rebound"}, {HSM:19197::"HSM present","no HSM"}, {Masses:19197::"Mass present: ***","no masses"}, {Flank:19197::"CVAT on ***","Flank bruit present on ***","no CVAT B"}, {AAA:19197::"Palpable prominent aortic pulse","No palpable prominent aortic pulse"}, {Surgical incision:19197::"Surg incisions: ***","Surgical incisions well healed"," "}  Musculo- skeletal M/S 5/5 throughout {MS:19197::"except ***"," "}, Extremities without ischemic changes {MS:19197::"except ***"," "}, {Edema:19197::"Pitting edema present: ***","Non-pitting edema present: ***","No edema present"}, {Varicosities:19197::"Varicosities present: ***","No visible varicosities "}, {LDS:19197::"Lipodermatosclerosis present: ***","No Lipodermatosclerosis present"}  Neurologic Cranial nerves 2-12 intact{CN:19197::" except ***"," "}, Pain and light touch intact in extremities{CN:19197::" except for decreased sensation in ***"," "}, Motor exam as listed above     Non-Invasive Vascular Imaging   BLE Venous Insufficiency Duplex (***):     RLE:   *** DVT and SVT,   *** GSV reflux,   *** SSV reflux,  *** deep venous reflux  LLE:  *** DVT and SVT,   *** GSV reflux,   *** SSV reflux,  *** deep venous reflux   Medical Decision Making   Ronald Gonzales is a 51 y.o. male who presents with: PE, RLE DVT with residual femoral vein thrombus, likely baseline BLE chronic venous insufficiency (C4), likely thrombophilia    Based on the patient's vascular studies and examination, I have offered the patient: ***.  I discussed with the patient the use of her 20-30 mm thigh high compression stockings ***and need for 3 month trial of such.  ***After a 3 months, she will follow up with my partners for evaluation in the [facility]  Thank you for allowing us to participate in this patient's care.  Adele Barthel, MD, FACS [facility] Office: [phone] Pager: [phone]

## 2016-09-28 ENCOUNTER — Telehealth (HOSPITAL_COMMUNITY): Payer: Self-pay

## 2016-09-28 ENCOUNTER — Ambulatory Visit: Payer: 59 | Admitting: Vascular Surgery

## 2016-09-28 ENCOUNTER — Inpatient Hospital Stay (HOSPITAL_COMMUNITY): Admission: RE | Admit: 2016-09-28 | Payer: 59 | Source: Ambulatory Visit

## 2016-09-28 NOTE — Telephone Encounter (Signed)
Patient called for results of his Factor VII lab studies. Reviewed with provider. Called patient back and let him know they were negative with understanding verbalized.

## 2016-10-12 ENCOUNTER — Ambulatory Visit (HOSPITAL_COMMUNITY): Payer: 59

## 2016-10-15 ENCOUNTER — Encounter (HOSPITAL_COMMUNITY): Payer: Self-pay

## 2016-10-23 NOTE — Progress Notes (Signed)
    Established Venous Insufficiency   History of Present Illness   Ronald Gonzales is a 51 y.o. (07-21-1965) male who presents with chief complaint: continued R>L leg swelling.  The patient has stopped Xarelto after seeing Hematology.  The patient's symptoms have not progressed.  The patient's symptoms are: swelling in R>L leg.  The patient is not compliant with compression stockings.  Pt returns for BLE venous reflux duplex.  The patient's PMH, PSH, SH, and FamHx are unchanged from 08/10/16.  Current Outpatient Prescriptions  Medication Sig Dispense Refill  . Flaxseed, Linseed, (FLAX SEED OIL PO) Take 1 capsule by mouth daily.    Marland Kitchen losartan (COZAAR) 25 MG tablet Take 25 mg by mouth daily.     No current facility-administered medications for this visit.    On ROS today: significant B leg swelling (L>R), no further anticoagulation   Physical Examination   Vitals:   10/26/16 1423  BP: 117/85  Pulse: 91  Resp: 16  SpO2: 96%  Weight: (!) 309 lb (140.2 kg)  Height:  (1.803 m)   Body mass index is 43.1 kg/m.   General Alert, O x 3, Obese, NAD  Pulmonary Sym exp, good B air movt, CTA B  Cardiac RRR, Nl S1, S2, no Murmurs, No rubs, No S3,S4  Vascular Vessel Right Left  Radial Palpable Palpable  Brachial Palpable Palpable  Carotid Palpable, No Bruit Palpable, No Bruit  Aorta Not palpable N/A  Femoral Palpable Palpable  Popliteal Not palpable Not palpable  PT Palpable Palpable  DP Palpable Palpable    Gastro- intestinal soft, non-distended, non-tender to palpation, No guarding or rebound, no HSM, no masses, no CVAT B, No palpable prominent aortic pulse,    Musculo- skeletal M/S 5/5 throughout  , Extremities without ischemic changes, BLE: 1-2+ edema, Varicosities present: R>L, Lipodermatosclerosis present: mild R  Neurologic Cranial nerves 2-12 intact , Pain and light touch intact in extremities , Motor exam as listed above     Non-Invasive Vascular  Imaging   BLE Venous Insufficiency Duplex (10/26/2016):   RLE:   no DVT and SVT,   no GSV reflux,   no SSV reflux,  + deep venous reflux: PV, SFJ  LLE:  no DVT and SVT,   + GSV reflux: 3.1-4.3 mm  no SSV reflux,  + deep venous reflux: CFV   Medical Decision Making   TARVARIS PUGLIA is a 51 y.o. (Dec 24, 1965) male  who presents with: PE, RLE DVT with residual femoral vein thrombus, likely baseline BLE chronic venous insufficiency (C4), possible thrombophilia    This patient's CVI study is not consistent with his clinical findings, which raise suspicions that he has iliac venous stenosis.  We discussed the work-up including CT Venogram vs venography.  As we discussed, however, there are NO FDA approved venous stents current, so this would be purely diagnostic procedure.  Recommended: restart thigh high compression stockings 20-30 mm Hg  Patient would like another opinion, so I'll have him come back in 3 months to see one of my partners in [facility].  Thank you for allowing [place] to participate in this patient's care.   Leonides Sake, MD, FACS [facility] Office: [phone] Pager: [phone]

## 2016-10-26 ENCOUNTER — Ambulatory Visit (HOSPITAL_COMMUNITY)
Admission: RE | Admit: 2016-10-26 | Discharge: 2016-10-26 | Disposition: A | Discharge status: Home / Self Care | Payer: 59 | Source: Ambulatory Visit | Attending: Vascular Surgery | Admitting: Vascular Surgery

## 2016-10-26 ENCOUNTER — Ambulatory Visit (INDEPENDENT_AMBULATORY_CARE_PROVIDER_SITE_OTHER): Payer: 59 | Admitting: Vascular Surgery

## 2016-10-26 ENCOUNTER — Encounter: Payer: Self-pay | Admitting: Vascular Surgery

## 2016-10-26 VITALS — BP 117/85 | HR 91 | Resp 16 | Ht 71.0 in | Wt 309.0 lb

## 2016-10-26 DIAGNOSIS — I82401 Acute embolism and thrombosis of unspecified deep veins of right lower extremity: Secondary | ICD-10-CM | POA: Diagnosis not present

## 2016-10-26 DIAGNOSIS — I83813 Varicose veins of bilateral lower extremities with pain: Secondary | ICD-10-CM | POA: Diagnosis not present

## 2016-10-26 DIAGNOSIS — I8393 Asymptomatic varicose veins of bilateral lower extremities: Secondary | ICD-10-CM | POA: Diagnosis not present

## 2016-10-26 DIAGNOSIS — I872 Venous insufficiency (chronic) (peripheral): Secondary | ICD-10-CM

## 2017-01-29 ENCOUNTER — Ambulatory Visit (INDEPENDENT_AMBULATORY_CARE_PROVIDER_SITE_OTHER): Payer: 59 | Admitting: Vascular Surgery

## 2017-01-29 ENCOUNTER — Other Ambulatory Visit: Payer: Self-pay

## 2017-01-29 ENCOUNTER — Encounter: Payer: Self-pay | Admitting: Vascular Surgery

## 2017-01-29 VITALS — BP 140/92 | HR 86 | Temp 97.3°F | Resp 18

## 2017-01-29 DIAGNOSIS — R600 Localized edema: Secondary | ICD-10-CM | POA: Diagnosis not present

## 2017-01-29 NOTE — Progress Notes (Signed)
Subjective:     Patient ID: Ronald Gonzales, male   DOB: [birth year], 52 y.o.   MRN: [id number]  HPI This 52 year old male returns after being evaluated by Dr. Claudie Fisherman 3 months ago for edema in the right lower extremity. Patient has a history of DVT and a pulmonary embolus about 18 months ago for unknown reason. Patient has had swelling in both legs since that time right worse and left. He was treated with anticoagulation for a period of time but this has been discontinued. He has tried elevation of the legs with long leg elastic compression stockings without much success but he does not put the stockings on immediately in the morning. The swelling extends up to the inguinal area.  Past Medical History:  Diagnosis Date  . CKD (chronic kidney disease) stage 3, GFR 30-59 ml/min (HCC)   . Hypertension    april 2017    Social History   Tobacco Use  . Smoking status: Never Smoker  . Smokeless tobacco: Never Used  Substance Use Topics  . Alcohol use: Yes    Comment: occasional    Family History  Problem Relation Age of Onset  . CVA Father     No Known Allergies   Current Outpatient Medications:  .  Flaxseed, Linseed, (FLAX SEED OIL PO), Take 1 capsule by mouth daily., Disp: , Rfl:  .  losartan (COZAAR) 25 MG tablet, Take 25 mg by mouth daily., Disp: , Rfl:   Vitals:   01/29/17 1334  BP: (!) 140/92  Pulse: 86  Resp: 18  Temp: (!) 97.3 F (36.3 C)  TempSrc: Oral    There is no height or weight on file to calculate BMI.         Review of Systems Denies chest pain, dyspnea on exertion, PND, orthopnea, hemoptysis    Objective:   Physical Exam BP (!) 140/92 (BP Location: Left Arm, Patient Position: Sitting, Cuff Size: Large)   Pulse 86   Temp (!) 97.3 F (36.3 C) (Oral)   Resp 18   Gen. well-developed obese male no apparent distress alert and oriented 3 Lungs no rhonchi or wheezing Right leg with obvious edema compared to the left with 2-3 cm in the calf and 2 cm in  the distal thigh large in the left side. No hyperpigmentation or ulceration noted. No bulging varicosities noted. 3+ to cells pedis pulse palpable.  I reviewed the results of the previous ultrasound which revealed an enlarged right great saphenous vein with no obvious reflux in a relatively normal-sized right small saphenous vein. There was no severe reflux in the deep system other than at the popliteal level  I performed a bedside sono site exam and agree that the right great saphenous vein is slightly enlarged but I did not identify any reflux     Assessment:     Edema right leg over past 18 months since episode of localized DVT in the right inguinal area with pulmonary embolus-subsequent ultrasounds have revealed no evidence of DVT at this time No evidence of significant reflux in the right great or small saphenous systems    Plan:     Do not feel that laser ablation right great saphenous vein we'll make significant difference at this time in the swelling of his right leg which extends well up into the proximal thigh and there is not severe reflux noted on exam Agree with Dr. Nicky Pugh assessment that CT venogram could be performed to see if there is a proximal vein  stenosis but there are no stents that have been approved for treatment Discussed this with patient and he will elect at this time to do nothing other than continue elevation and elastic compression stocking and return to see us on a when necessary basis

## 2017-02-14 DIAGNOSIS — I129 Hypertensive chronic kidney disease with stage 1 through stage 4 chronic kidney disease, or unspecified chronic kidney disease: Secondary | ICD-10-CM | POA: Diagnosis not present

## 2017-02-14 DIAGNOSIS — N183 Chronic kidney disease, stage 3 (moderate): Secondary | ICD-10-CM | POA: Diagnosis not present

## 2017-02-14 DIAGNOSIS — I82409 Acute embolism and thrombosis of unspecified deep veins of unspecified lower extremity: Secondary | ICD-10-CM | POA: Diagnosis not present

## 2017-02-18 ENCOUNTER — Telehealth: Payer: Self-pay

## 2017-02-18 NOTE — Telephone Encounter (Signed)
SENT REFERRAL AND NOTES TO SCHEDULING

## 2017-02-22 ENCOUNTER — Telehealth (HOSPITAL_COMMUNITY): Payer: Self-pay | Admitting: Nephrology

## 2017-02-28 NOTE — Telephone Encounter (Signed)
User: Trina AoGRIFFIN, Ronald Gonzales A Date/time: 02/25/17 1:47 PM  Comment: Called pt and spoke with him about scheduling an echo and he stated that he wanted to speak with his doctor before scheduling this test.   Context:  Outcome: Completed  Phone number: [phone] Phone Type: Home Phone  Comm. type: Telephone Call type: Outgoing  Contact: Ronald Gonzales, [name] Relation to patient: Self    User: Trina AoGRIFFIN, [name] Date/time: 02/22/17 3:20 PM  Comment: Called pt and lmsg for him to CB to get scheduled for an echo.  Context:  Outcome: Left Message  Phone number: [phone] Phone Type: Home Phone  Comm. type: Telephone Call type: Outgoing  Contact: Ronald Gonzales, [name] Relation to patient: Self

## 2017-03-20 DIAGNOSIS — N183 Chronic kidney disease, stage 3 (moderate): Secondary | ICD-10-CM | POA: Diagnosis not present

## 2017-03-20 DIAGNOSIS — R600 Localized edema: Secondary | ICD-10-CM | POA: Diagnosis not present

## 2017-03-20 DIAGNOSIS — I129 Hypertensive chronic kidney disease with stage 1 through stage 4 chronic kidney disease, or unspecified chronic kidney disease: Secondary | ICD-10-CM | POA: Diagnosis not present

## 2017-03-22 ENCOUNTER — Telehealth: Payer: Self-pay

## 2017-03-22 NOTE — Telephone Encounter (Signed)
NOTES IN CHART PREP 

## 2017-03-25 ENCOUNTER — Telehealth: Payer: Self-pay

## 2017-03-25 ENCOUNTER — Telehealth (HOSPITAL_COMMUNITY): Payer: Self-pay | Admitting: Nephrology

## 2017-03-25 NOTE — Telephone Encounter (Signed)
[facility] [phone] Dr Assunta Foundjohn Golding

## 2017-03-27 ENCOUNTER — Other Ambulatory Visit: Payer: Self-pay | Admitting: Nephrology

## 2017-03-27 DIAGNOSIS — I824Z9 Acute embolism and thrombosis of unspecified deep veins of unspecified distal lower extremity: Secondary | ICD-10-CM

## 2017-03-27 DIAGNOSIS — I10 Essential (primary) hypertension: Secondary | ICD-10-CM

## 2017-03-27 DIAGNOSIS — R600 Localized edema: Secondary | ICD-10-CM

## 2017-03-27 NOTE — Telephone Encounter (Signed)
User: Ronald Gonzales, [name] Date/time: 03/25/17 2:34 PM  Comment: Called pt and lmsg for him to CB to get scheduled for an echo.   Context:  Outcome: Left Message  Phone number: [phone] Phone Type: Home Phone  Comm. type: Telephone Call type: Outgoing  Contact: [name], [name] Relation to patient: Self

## 2017-04-04 ENCOUNTER — Ambulatory Visit (HOSPITAL_COMMUNITY): Payer: 59 | Attending: Cardiovascular Disease

## 2017-04-04 ENCOUNTER — Other Ambulatory Visit: Payer: Self-pay

## 2017-04-04 DIAGNOSIS — I5030 Unspecified diastolic (congestive) heart failure: Secondary | ICD-10-CM | POA: Diagnosis not present

## 2017-04-04 DIAGNOSIS — I10 Essential (primary) hypertension: Secondary | ICD-10-CM

## 2017-04-04 DIAGNOSIS — R600 Localized edema: Secondary | ICD-10-CM

## 2017-04-04 DIAGNOSIS — I824Z9 Acute embolism and thrombosis of unspecified deep veins of unspecified distal lower extremity: Secondary | ICD-10-CM | POA: Diagnosis not present

## 2017-06-10 ENCOUNTER — Encounter (HOSPITAL_COMMUNITY): Payer: Self-pay | Admitting: Nephrology

## 2017-06-17 IMAGING — NM NM PULMONARY VENT & PERF
14 series · 14 of 14 positions shown · non-contrast
Comparison: Chest radiograph November 28, 2015

ADDENDUM:
Critical Value/emergent results were called by telephone at the time
of interpretation on 11/28/2015 at [DATE] to Dr. DETI AMARLIS MISTERIOZE , who
verbally acknowledged these results.
CLINICAL DATA: Shortness of breath for 3 days

EXAM:
NUCLEAR MEDICINE VENTILATION - PERFUSION LUNG SCAN
VIEWS:
Anterior, posterior, left lateral, right lateral, RPO, LPO, RAO, LAO
-ventilation and perfusion
RADIOPHARMACEUTICALS:  32.0 mCi Xechnetium-LLm DTPA aerosol
inhalation and 4.0 mCi Xechnetium-LLm MAA IV

[Series 1: ant/post vent · 4.14mm/px · 1 of 1 slices shown (1 of 2)]
[im 1/1]
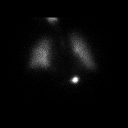

[Series 1: ant/post vent · 4.14mm/px · 1 of 1 slices shown (2 of 2)]
[im 1/1]
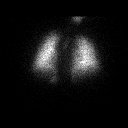

[Series 2: lao/rpo vent · 4.14mm/px · 1 of 1 slices shown (1 of 2)]
[im 1/1]
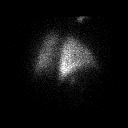

[Series 2: lao/rpo vent · 4.14mm/px · 1 of 1 slices shown (2 of 2)]
[im 1/1]
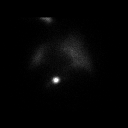

[Series 3: lt lat/rt lat vent · 4.14mm/px · 1 of 1 slices shown (1 of 2)]
[im 1/1]
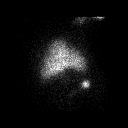

[Series 3: lt lat/rt lat vent · 4.14mm/px · 1 of 1 slices shown (2 of 2)]
[im 1/1]
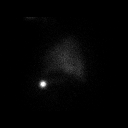

[Series 4: lpo/rao vent · 4.14mm/px · 1 of 1 slices shown (1 of 2)]
[im 1/1]
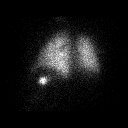

[Series 4: lpo/rao vent · 4.14mm/px · 1 of 1 slices shown (2 of 2)]
[im 1/1]
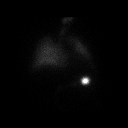

[Series 6: lao/rpo perf · 4.14mm/px · 1 of 1 slices shown (1 of 2)]
[im 1/1]
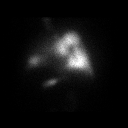

[Series 6: lao/rpo perf · 4.14mm/px · 1 of 1 slices shown (2 of 2)]
[im 1/1]
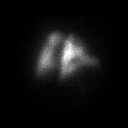

[Series 7: lt lat/rt lat perf · 4.14mm/px · 1 of 1 slices shown (1 of 2)]
[im 1/1]
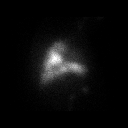

[Series 7: lt lat/rt lat perf · 4.14mm/px · 1 of 1 slices shown (2 of 2)]
[im 1/1]
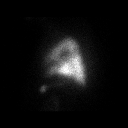

[Series 8: lpo/rao perf · 4.14mm/px · 1 of 1 slices shown (1 of 2)]
[im 1/1]
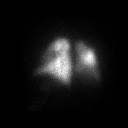

[Series 8: lpo/rao perf · 4.14mm/px · 1 of 1 slices shown (2 of 2)]
[im 1/1]
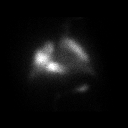

[14 of 14 positions shown; findings below may reference images not displayed]

FINDINGS: Ventilation: Radiotracer uptake on the ventilation study is
homogeneous and symmetric bilaterally. No focal ventilation defects
identified.

Perfusion: On the perfusion study, there is significant decreased
radiotracer uptake in the superior and inferior segments of the
lingula, best appreciable on the LPO and left lateral views. On the
right lateral and RPO views, there is decreased uptake in the
anterior segment right upper lobe. These areas appear to represent
significant ventilation/perfusion mismatches.
IMPRESSION: Segmental perfusion defects are noted in the superior and inferior
segments of the lingula and in the anterior segment of the right
upper lobe. There are no corresponding ventilation defects. This
study is felt to represent an overall high probability of pulmonary
embolus.

## 2017-10-12 IMAGING — US US BIOPSY
1 series · 8 of 8 positions shown · non-contrast
Comparison: None

CLINICAL DATA: Renal insufficiency.  Hypertension.

EXAM:
ULTRASOUND GUIDED RENAL CORE BIOPSY
TECHNIQUE: Survey ultrasound was performed and an appropriate skin entry site
was localized. Site was marked, prepped with Betadine, draped in
usual sterile fashion, infiltrated locally with 1% lidocaine.
Intravenous Fentanyl and Versed were administered as conscious
sedation during continuous cardiorespiratory monitoring by the
radiology RN with a total moderate sedation time of 11 minutes.
Under real time ultrasound guidance, a 15 gauge trocar guide needle
was advanced to the margin of the lower pole of the lower pole left
kidney for 2 coaxial 16 gauge core biopsy needle passes. The core
samples were submitted to pathology. The patient tolerated procedure
well.
COMPLICATIONS:
None.

[Series 1: us biopsy · 0.26mm/px · 8 of 8 slices shown]
[im 1/8]
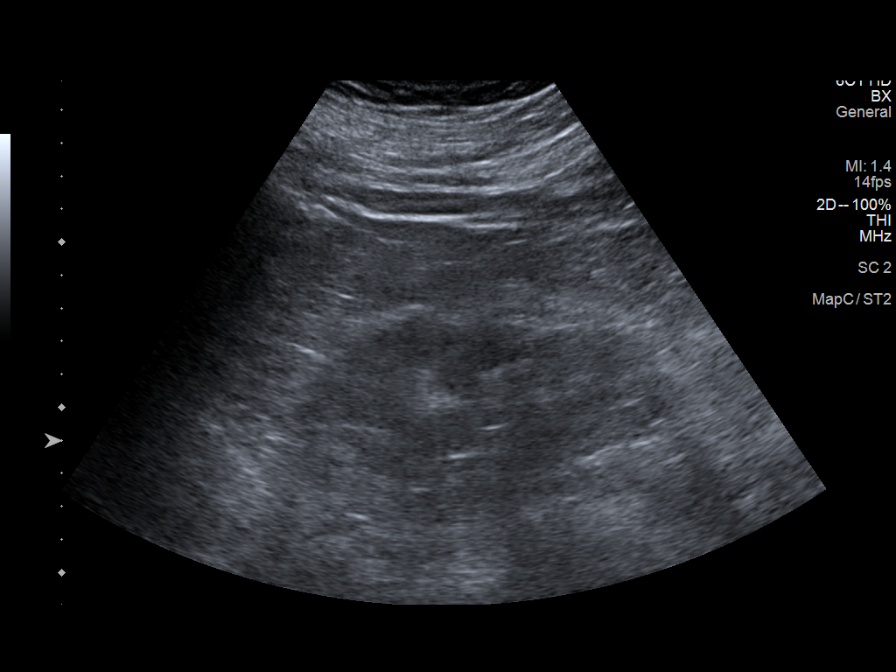
[im 2/8]
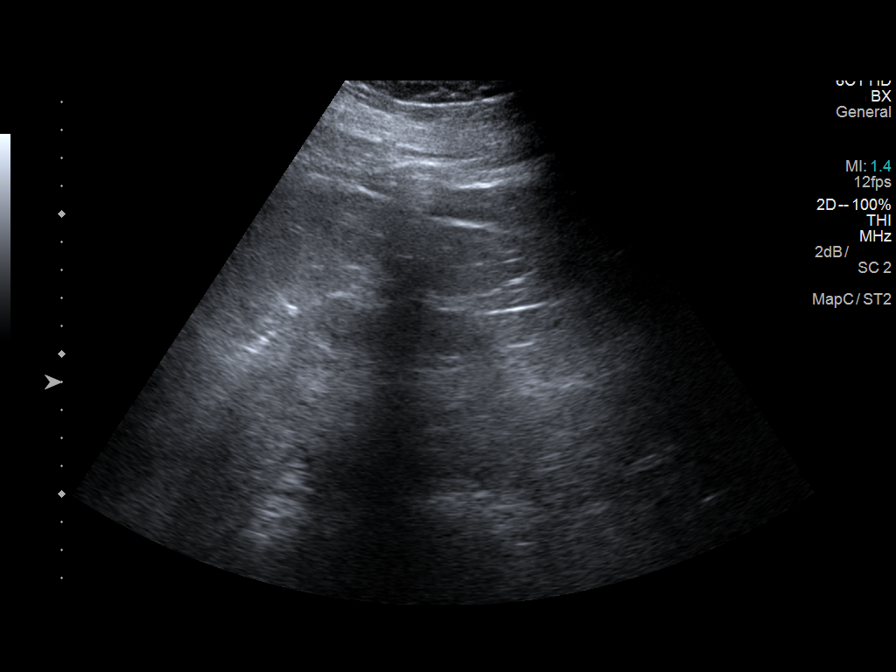
[im 3/8]
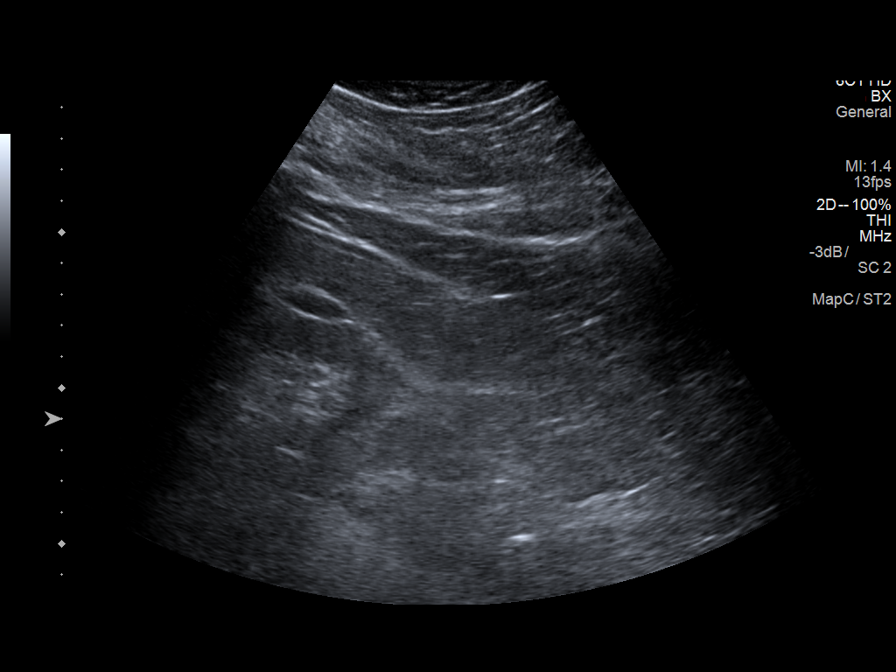
[im 4/8]
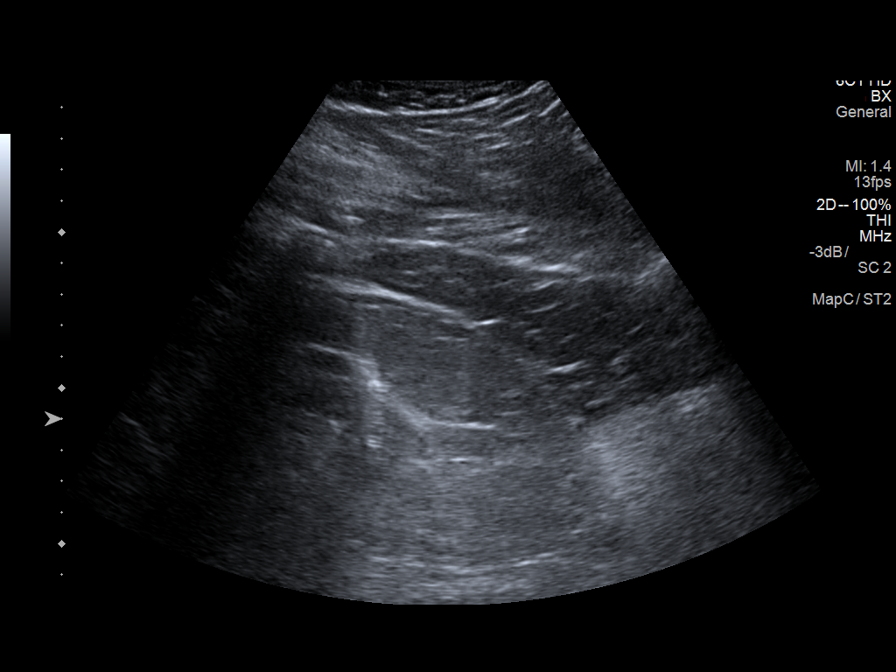
[im 5/8]
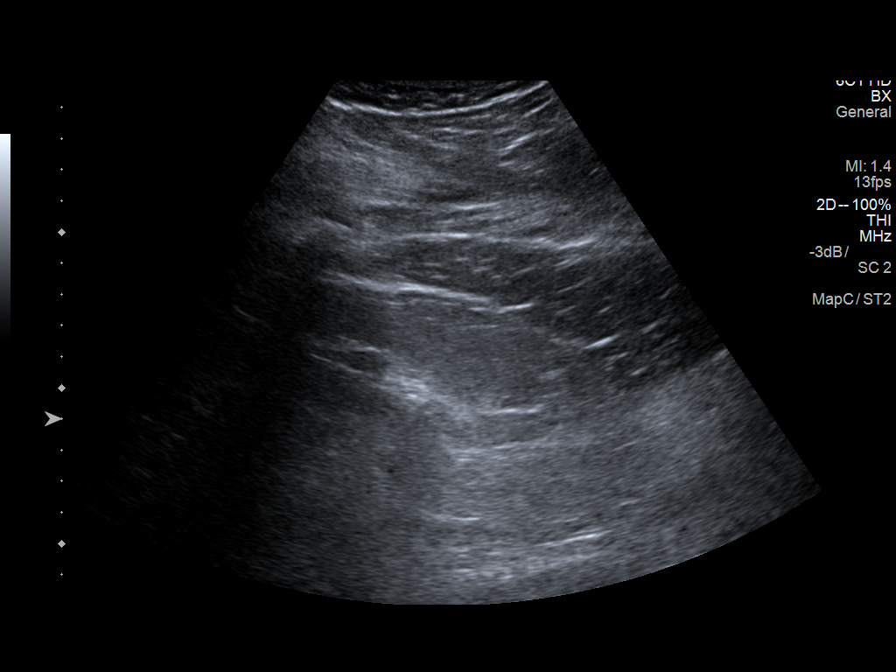
[im 6/8]
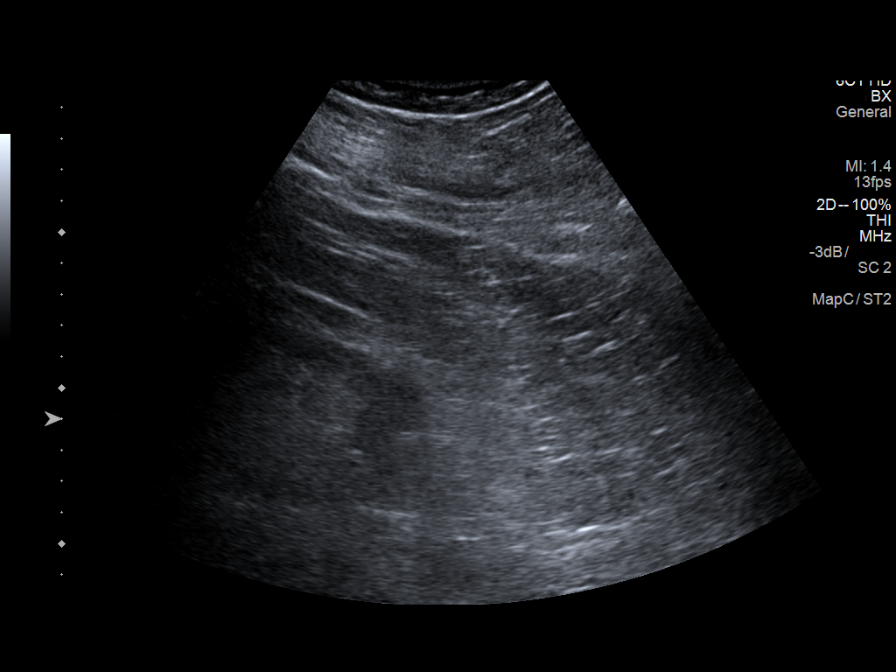
[im 7/8]
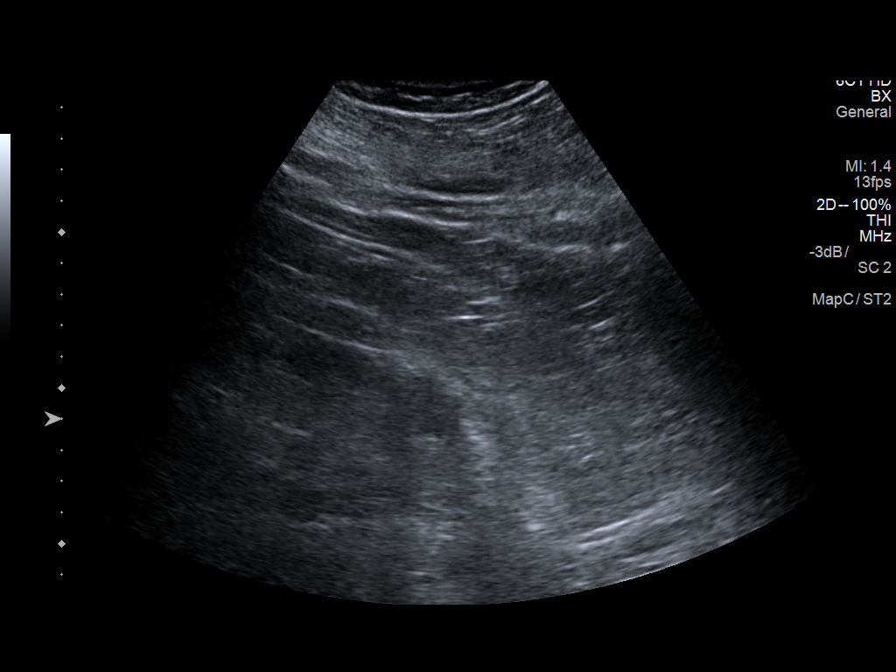
[im 8/8]
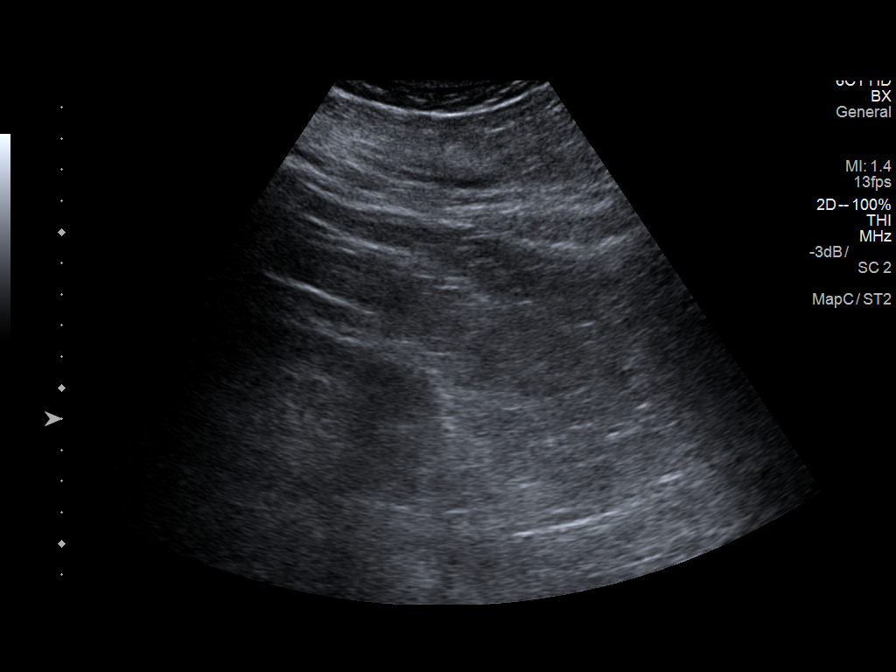

[8 of 8 positions shown; findings below may reference images not displayed]

IMPRESSION: 1. Technically successful ultrasound-guided core renal biopsy ,
lower pole left kidney.

## 2017-11-07 DIAGNOSIS — J019 Acute sinusitis, unspecified: Secondary | ICD-10-CM | POA: Diagnosis not present

## 2017-11-07 DIAGNOSIS — Z6841 Body Mass Index (BMI) 40.0 and over, adult: Secondary | ICD-10-CM | POA: Diagnosis not present

## 2017-11-07 DIAGNOSIS — J301 Allergic rhinitis due to pollen: Secondary | ICD-10-CM | POA: Diagnosis not present

## 2018-01-02 ENCOUNTER — Emergency Department (HOSPITAL_COMMUNITY): Payer: 59

## 2018-01-02 ENCOUNTER — Other Ambulatory Visit: Payer: Self-pay

## 2018-01-02 ENCOUNTER — Encounter (HOSPITAL_COMMUNITY): Payer: Self-pay | Admitting: Emergency Medicine

## 2018-01-02 ENCOUNTER — Emergency Department (HOSPITAL_COMMUNITY)
Admission: EM | Admit: 2018-01-02 | Discharge: 2018-01-02 | Disposition: A | Discharge status: Home / Self Care | Payer: 59 | Attending: Emergency Medicine | Admitting: Emergency Medicine

## 2018-01-02 DIAGNOSIS — I129 Hypertensive chronic kidney disease with stage 1 through stage 4 chronic kidney disease, or unspecified chronic kidney disease: Secondary | ICD-10-CM | POA: Diagnosis not present

## 2018-01-02 DIAGNOSIS — N289 Disorder of kidney and ureter, unspecified: Secondary | ICD-10-CM | POA: Diagnosis not present

## 2018-01-02 DIAGNOSIS — M79662 Pain in left lower leg: Secondary | ICD-10-CM | POA: Diagnosis not present

## 2018-01-02 DIAGNOSIS — Z79899 Other long term (current) drug therapy: Secondary | ICD-10-CM | POA: Diagnosis not present

## 2018-01-02 DIAGNOSIS — M79674 Pain in right toe(s): Secondary | ICD-10-CM | POA: Diagnosis not present

## 2018-01-02 DIAGNOSIS — M1A0710 Idiopathic chronic gout, right ankle and foot, without tophus (tophi): Secondary | ICD-10-CM | POA: Insufficient documentation

## 2018-01-02 DIAGNOSIS — N183 Chronic kidney disease, stage 3 (moderate): Secondary | ICD-10-CM | POA: Diagnosis not present

## 2018-01-02 DIAGNOSIS — M79605 Pain in left leg: Secondary | ICD-10-CM | POA: Insufficient documentation

## 2018-01-02 DIAGNOSIS — M109 Gout, unspecified: Secondary | ICD-10-CM

## 2018-01-02 DIAGNOSIS — M10071 Idiopathic gout, right ankle and foot: Secondary | ICD-10-CM | POA: Diagnosis not present

## 2018-01-02 HISTORY — DX: Other pulmonary embolism without acute cor pulmonale: I26.99

## 2018-01-02 HISTORY — DX: Acute embolism and thrombosis of unspecified deep veins of unspecified lower extremity: I82.409

## 2018-01-02 LAB — BASIC METABOLIC PANEL
Anion gap: 10 (ref 5–15)
BUN: 37 mg/dL — AB (ref 6–20)
CALCIUM: 8.8 mg/dL — AB (ref 8.9–10.3)
CHLORIDE: 107 mmol/L (ref 98–111)
CO2: 21 mmol/L — ABNORMAL LOW (ref 22–32)
Creatinine, Ser: 2.86 mg/dL — ABNORMAL HIGH (ref 0.61–1.24)
GFR calc Af Amer: 28 mL/min — ABNORMAL LOW (ref >60)
GFR calc non Af Amer: 24 mL/min — ABNORMAL LOW (ref >60)
GLUCOSE: 86 mg/dL (ref 70–99)
POTASSIUM: 4.5 mmol/L (ref 3.5–5.1)
Sodium: 138 mmol/L (ref 135–145)

## 2018-01-02 LAB — CBC WITH DIFFERENTIAL/PLATELET
Abs Immature Granulocytes: 0.04 10*3/uL (ref 0.00–0.07)
BASOS ABS: 0.1 10*3/uL (ref 0.0–0.1)
BASOS PCT: 1 %
Eosinophils Absolute: 0.2 10*3/uL (ref 0.0–0.5)
Eosinophils Relative: 2 %
HEMATOCRIT: 45.7 % (ref 39.0–52.0)
HEMOGLOBIN: 14 g/dL (ref 13.0–17.0)
IMMATURE GRANULOCYTES: 0 %
Lymphocytes Relative: 18 %
Lymphs Abs: 1.8 10*3/uL (ref 0.7–4.0)
MCH: 28.6 pg (ref 26.0–34.0)
MCHC: 30.6 g/dL (ref 30.0–36.0)
MCV: 93.5 fL (ref 80.0–100.0)
MONO ABS: 1.2 10*3/uL — AB (ref 0.1–1.0)
MONOS PCT: 12 %
NEUTROS PCT: 67 %
NRBC: 0 % (ref 0.0–0.2)
Neutro Abs: 6.4 10*3/uL (ref 1.7–7.7)
PLATELETS: 224 10*3/uL (ref 150–400)
RBC: 4.89 MIL/uL (ref 4.22–5.81)
RDW: 13 % (ref 11.5–15.5)
WBC: 9.7 10*3/uL (ref 4.0–10.5)

## 2018-01-02 LAB — URIC ACID: URIC ACID, SERUM: 11.8 mg/dL — AB (ref 3.7–8.6)

## 2018-01-02 MED ORDER — PREDNISONE 10 MG PO TABS: ORAL_TABLET | ORAL | 0 refills | Status: DC

## 2018-01-02 MED ORDER — PREDNISONE 50 MG PO TABS
60.0000 mg | ORAL_TABLET | Freq: Once | ORAL | Status: AC
  Administered 2018-01-02: 60 mg via ORAL
  Filled 2018-01-02: qty 1

## 2018-01-02 MED ORDER — TRAMADOL HCL 50 MG PO TABS
50.0000 mg | ORAL_TABLET | Freq: Once | ORAL | Status: AC
  Administered 2018-01-02: 50 mg via ORAL
  Filled 2018-01-02: qty 1

## 2018-01-02 MED ORDER — TRAMADOL HCL 50 MG PO TABS: 50.0000 mg | ORAL_TABLET | Freq: Four times a day (QID) | ORAL | 0 refills | Status: DC | PRN

## 2018-01-02 NOTE — ED Triage Notes (Signed)
PT c/o left lower leg pain with no injury x1 week. PT states hx of DVT to right upper leg but no longer taking xarelto.

## 2018-01-02 NOTE — Discharge Instructions (Addendum)
Your lab tests today are pretty suggestive for gout being the source of the pain in your right great toe as your uric acid level is significantly elevated.  You have been prescribed prednisone to help resolve this flare up.  Elevation and warm compresses can also help with your toe pain.  Also, you have been scheduled for an ultrasound of your leg here with our radiology department.    Your kidney function (creatinine level) is reduced with todays lab test being 2.86.  Let your renal doctor know about this result and keep your appointment on Tuesday as planned.

## 2018-01-02 NOTE — ED Provider Notes (Signed)
[facility] EMERGENCY DEPARTMENT Provider Note   CSN: [id number] Arrival date & time: 01/02/18  1738     History   Chief Complaint Chief Complaint  Patient presents with  . Leg Pain    HPI Ronald Gonzales is a 52 y.o. male with a past medical history significant for DVT with PE in 2017, reportedly unprovoked with negative coagulopathy panel presenting with a 1 week history of soreness in his left calf despite no recognized trauma or increased activity to explain muscle strain.  He denies shortness of breath and chest pain and also denies swelling in the leg.  He was on Xaralto for approx 9 months, then advised he no longer needed to be on this medicine.  Interestingly both he and his sister have had dvt's for unclear reasons.  Secondly he describes intermittent chronic pain and swelling in his right great toe base with an active flare up.  He has had this intermittent symptom for about 8 months now and questions whether he might have gout.  The toe becomes swollen, slightly red, then resolves after sx for about a week. He has found no alleviators for this symptom.  Strong fam hx of gout.  The history is provided by the patient.    Past Medical History:  Diagnosis Date  . CKD (chronic kidney disease) stage 3, GFR 30-59 ml/min (HCC)   . DVT (deep venous thrombosis) (HCC)   . Hypertension    april 2017  . Pulmonary emboli [facility])     Patient Active Problem List   Diagnosis Date Noted  . Leg edema, right 01/29/2017  . DVT (deep venous thrombosis) (HCC) 11/28/2015  . CKD (chronic kidney disease) stage 3, GFR 30-59 ml/min (HCC) 11/28/2015  . Essential hypertension 11/28/2015  . Elevated troponin 11/28/2015  . Acute pulmonary embolism (HCC) 11/28/2015  . Adjustment disorder with depressed mood 11/28/2015    Past Surgical History:  Procedure Laterality Date  . RENAL BIOPSY Left 09/21/2016        Home Medications    Prior to Admission medications   Medication Sig Start Date  End Date Taking? Authorizing Provider  Flaxseed, Linseed, (FLAX SEED OIL PO) Take 1 capsule by mouth daily.    [provider]  losartan (COZAAR) 25 MG tablet Take 25 mg by mouth daily.    [provider]  predniSONE (DELTASONE) 10 MG tablet Take 6 tablets day one, 5 tablets day two, 4 tablets day three, 3 tablets day four, 2 tablets day five, then 1 tablet day six 01/02/18   Jayziah Bankhead, Raynelle Fanning, PA-C  traMADol (ULTRAM) 50 MG tablet Take 1 tablet (50 mg total) by mouth every 6 (six) hours as needed. 01/02/18   Burgess Amor, PA-C    Family History Family History  Problem Relation Age of Onset  . CVA Father     Social History Social History   Tobacco Use  . Smoking status: Never Smoker  . Smokeless tobacco: Current User    Types: Chew  Substance Use Topics  . Alcohol use: Yes    Comment: occasional  . Drug use: No     Allergies   Patient has no known allergies.   Review of Systems Review of Systems  Constitutional: Negative for chills and fever.  HENT: Negative for congestion and sore throat.   Eyes: Negative.   Respiratory: Negative for cough, chest tightness, shortness of breath and wheezing.   Cardiovascular: Negative for chest pain, palpitations and leg swelling.  Gastrointestinal: Negative for abdominal  pain, nausea and vomiting.  Genitourinary: Negative.   Musculoskeletal: Positive for arthralgias, joint swelling and myalgias. Negative for neck pain.  Skin: Negative.  Negative for color change, rash and wound.  Neurological: Negative for dizziness, weakness, light-headedness, numbness and headaches.  Psychiatric/Behavioral: Negative.      Physical Exam Updated Vital Signs BP 103/77 (BP Location: Right Arm)   Pulse 91   Temp 98.6 F (37 C) (Oral)   Resp (!) 21   Ht 5\' 11"  (1.803 m)   Wt 135.2 kg   SpO2 98%   BMI 41.56 kg/m   Physical Exam Vitals signs and nursing note reviewed.  Constitutional:      Appearance: Normal appearance. He is  well-developed. He is obese.  HENT:     Head: Normocephalic and atraumatic.  Neck:     Musculoskeletal: Normal range of motion.  Cardiovascular:     Rate and Rhythm: Normal rate and regular rhythm.     Heart sounds: Normal heart sounds.     Comments: Borderline tachy Pulmonary:     Effort: Pulmonary effort is normal.     Breath sounds: Normal breath sounds. No wheezing or rhonchi.  Musculoskeletal: Normal range of motion.        General: Swelling and tenderness present.     Right lower leg: No edema.     Left lower leg: No edema.     Comments: Bilateral lower extremities are symmetric.  Soreness to palpation left lateral calf.  No palpable cords. No edema noted.  Dorsalis pedal pulse in full and equal bilaterally.  Positive Homan's sign. Right great toe mcp joint ttp with mild erythema noted lateral joint. Skin intact. No red streaking.   Skin:    General: Skin is warm and dry.  Neurological:     Mental Status: He is alert.      ED Treatments / Results  Labs (all labs ordered are listed, but only abnormal results are displayed) Labs Reviewed  URIC ACID - Abnormal; Notable for the following components:      Result Value   Uric Acid, Serum 11.8 (*)    All other components within normal limits  BASIC METABOLIC PANEL - Abnormal; Notable for the following components:   CO2 21 (*)    BUN 37 (*)    Creatinine, Ser 2.86 (*)    Calcium 8.8 (*)    GFR calc non Af Amer 24 (*)    GFR calc Af Amer 28 (*)    All other components within normal limits  CBC WITH DIFFERENTIAL/PLATELET - Abnormal; Notable for the following components:   Monocytes Absolute 1.2 (*)    All other components within normal limits    EKG None  Radiology Dg Foot Complete Right  Result Date: 01/02/2018 CLINICAL DATA:  Intermittent right big toe pain for the past 6-8 months. No known injury. EXAM: RIGHT FOOT COMPLETE - 3+ VIEW COMPARISON:  None. FINDINGS: Minimal spur formation at the 1st MTP joint laterally.  Minimal spur formation at the articulation of the 1st metatarsal and medial cuneiform. Mild to moderate inferior calcaneal spur formation. Unremarkable soft tissues. IMPRESSION: Minimal degenerative changes, as described above. Electronically Signed   By: Beckie Salts M.D.   On: 01/02/2018 19:13    Procedures Procedures (including critical care time)  Medications Ordered in ED Medications  traMADol (ULTRAM) tablet 50 mg (50 mg Oral Given 01/02/18 2126)  predniSONE (DELTASONE) tablet 60 mg (60 mg Oral Given 01/02/18 2126)     Initial Impression /  Assessment and Plan / ED Course  I have reviewed the triage vital signs and the nursing notes.  Pertinent labs & imaging results that were available during my care of the patient were reviewed by me and considered in my medical decision making (see chart for details).     Pt with suspect gouty flare in the right great toe mtp joint.  He has fair ROM of the joint, doubt septic joint.  Pt with left lateral calf soreness of 1 week duration.  He is hopeful this is simply a muscle strain, but after 1 week of constant sx, concerned about possible dvt.  Exam is not classic for dvt but given hx of dvt/PE he will need US to rule this out.  No sob, no cp.   Labs and imaging reviewed, sig elevation in uric acid suggesting this is gout.  He was placed on prednisone taper, discussed elevation, heat.  US LLE ordered as outpt.  Dr. Pilar PlateBero also saw pt during this visit. Discussed elevation in creatinine which is a chronic finding - he is scheduled to see his nephrologist in 5 days.  Final Clinical Impressions(s) / ED Diagnoses   Final diagnoses:  Acute gout involving toe of right foot, unspecified cause  Pain of left calf  Renal insufficiency    ED Discharge Orders         Ordered    predniSONE (DELTASONE) 10 MG tablet     01/02/18 2032    US Venous Img Lower Unilateral Left     01/02/18 2034    traMADol (ULTRAM) 50 MG tablet  Every 6 hours PRN      01/02/18 2120           Burgess Amordol, Jahquez Steffler, Cordelia Poche-C 01/02/18 2209    Sabas SousBero, Michael M, MD 01/03/18 [phone]

## 2018-01-03 ENCOUNTER — Encounter (HOSPITAL_COMMUNITY): Payer: Self-pay | Admitting: Emergency Medicine

## 2018-01-03 ENCOUNTER — Ambulatory Visit (HOSPITAL_COMMUNITY)
Admission: RE | Admit: 2018-01-03 | Discharge: 2018-01-03 | Disposition: A | Discharge status: Home / Self Care | Payer: 59 | Source: Ambulatory Visit | Attending: Emergency Medicine | Admitting: Emergency Medicine

## 2018-01-03 ENCOUNTER — Emergency Department (HOSPITAL_COMMUNITY)
Admission: EM | Admit: 2018-01-03 | Discharge: 2018-01-03 | Disposition: A | Discharge status: Home / Self Care | Payer: 59 | Attending: Emergency Medicine | Admitting: Emergency Medicine

## 2018-01-03 ENCOUNTER — Other Ambulatory Visit: Payer: Self-pay

## 2018-01-03 DIAGNOSIS — F17220 Nicotine dependence, chewing tobacco, uncomplicated: Secondary | ICD-10-CM | POA: Insufficient documentation

## 2018-01-03 DIAGNOSIS — M79605 Pain in left leg: Secondary | ICD-10-CM | POA: Diagnosis present

## 2018-01-03 DIAGNOSIS — N183 Chronic kidney disease, stage 3 (moderate): Secondary | ICD-10-CM | POA: Diagnosis not present

## 2018-01-03 DIAGNOSIS — I129 Hypertensive chronic kidney disease with stage 1 through stage 4 chronic kidney disease, or unspecified chronic kidney disease: Secondary | ICD-10-CM | POA: Diagnosis not present

## 2018-01-03 DIAGNOSIS — I82492 Acute embolism and thrombosis of other specified deep vein of left lower extremity: Secondary | ICD-10-CM | POA: Insufficient documentation

## 2018-01-03 DIAGNOSIS — Z79899 Other long term (current) drug therapy: Secondary | ICD-10-CM | POA: Insufficient documentation

## 2018-01-03 DIAGNOSIS — I82432 Acute embolism and thrombosis of left popliteal vein: Secondary | ICD-10-CM

## 2018-01-03 DIAGNOSIS — M79662 Pain in left lower leg: Secondary | ICD-10-CM

## 2018-01-03 LAB — BASIC METABOLIC PANEL
Anion gap: 10 (ref 5–15)
BUN: 50 mg/dL — ABNORMAL HIGH (ref 6–20)
CO2: 21 mmol/L — ABNORMAL LOW (ref 22–32)
Calcium: 9.6 mg/dL (ref 8.9–10.3)
Chloride: 105 mmol/L (ref 98–111)
Creatinine, Ser: 3.04 mg/dL — ABNORMAL HIGH (ref 0.61–1.24)
GFR calc Af Amer: 26 mL/min — ABNORMAL LOW (ref >60)
GFR, EST NON AFRICAN AMERICAN: 23 mL/min — AB (ref >60)
GLUCOSE: 107 mg/dL — AB (ref 70–99)
Potassium: 5 mmol/L (ref 3.5–5.1)
Sodium: 136 mmol/L (ref 135–145)

## 2018-01-03 LAB — PROTIME-INR
INR: 1
Prothrombin Time: 13.1 seconds (ref 11.4–15.2)

## 2018-01-03 MED ORDER — RIVAROXABAN (XARELTO) VTE STARTER PACK (15 & 20 MG): 15.0000 mg | ORAL_TABLET | Freq: Two times a day (BID) | ORAL | 0 refills | Status: DC

## 2018-01-03 NOTE — ED Provider Notes (Signed)
[facility] EMERGENCY DEPARTMENT Provider Note   CSN: [id number] Arrival date & time: 01/03/18  1018     History   Chief Complaint Chief Complaint  Patient presents with  . Leg Pain    HPI [name] is a 52 y.o. male.  Patient seen yesterday in the emergency department with a complaint of left lower leg pain without an injury has been bothering him for about a week.  Patient is had a history of DVT in the right upper leg in the past had been on Xarelto but no longer on it.  Evaluation yesterday also raising concerns for gout patient's uric acid was markedly elevated.  Patient had a bedside ultrasound done by the emergency physician without any acute findings.  Patient had formal ultrasound scheduled for this morning.  The results of that were consistent with a popliteal and tibial deep vein thrombosis in the left leg.  Patient without any chest pain or shortness of breath.  Labs from yesterday showed significant renal insufficiency with GFR's in the 20 range.  Creatinines in the 2 range.     Past Medical History:  Diagnosis Date  . CKD (chronic kidney disease) stage 3, GFR 30-59 ml/min (HCC)   . DVT (deep venous thrombosis) (HCC)   . Hypertension    april 2017  . Pulmonary emboli [facility])     Patient Active Problem List   Diagnosis Date Noted  . Leg edema, right 01/29/2017  . DVT (deep venous thrombosis) (HCC) 11/28/2015  . CKD (chronic kidney disease) stage 3, GFR 30-59 ml/min (HCC) 11/28/2015  . Essential hypertension 11/28/2015  . Elevated troponin 11/28/2015  . Acute pulmonary embolism (HCC) 11/28/2015  . Adjustment disorder with depressed mood 11/28/2015    Past Surgical History:  Procedure Laterality Date  . RENAL BIOPSY Left 09/21/2016        Home Medications    Prior to Admission medications   Medication Sig Start Date End Date Taking? Authorizing Provider  Flaxseed, Linseed, (FLAX SEED OIL PO) Take 1 capsule by mouth daily.    [provider]  losartan (COZAAR) 25 MG tablet Take 25 mg by mouth daily.    [provider]  predniSONE (DELTASONE) 10 MG tablet Take 6 tablets day one, 5 tablets day two, 4 tablets day three, 3 tablets day four, 2 tablets day five, then 1 tablet day six 01/02/18   Idol, Raynelle Fanning, PA-C  Rivaroxaban 15 & 20 MG TBPK Take 15 mg by mouth 2 (two) times daily. Take as directed on package: Start with one 15mg  tablet by mouth twice a day with food. On Day 22, switch to one 20mg  tablet once a day with food. 01/03/18   Vanetta Mulders, MD  traMADol (ULTRAM) 50 MG tablet Take 1 tablet (50 mg total) by mouth every 6 (six) hours as needed. 01/02/18   Burgess Amor, PA-C    Family History Family History  Problem Relation Age of Onset  . CVA Father     Social History Social History   Tobacco Use  . Smoking status: Never Smoker  . Smokeless tobacco: Current User    Types: Chew  Substance Use Topics  . Alcohol use: Yes    Comment: occasional  . Drug use: No     Allergies   Patient has no known allergies.   Review of Systems Review of Systems  Constitutional: Negative for fever.  HENT: Negative for congestion.   Eyes: Negative for visual disturbance.  Respiratory: Negative for shortness  of breath.   Cardiovascular: Positive for leg swelling. Negative for chest pain.  Gastrointestinal: Negative for abdominal pain.  Genitourinary: Negative for dysuria.  Musculoskeletal: Negative for myalgias.  Skin: Negative for wound.  Neurological: Negative for syncope.  Hematological: Does not bruise/bleed easily.  Psychiatric/Behavioral: Negative for confusion.     Physical Exam Updated Vital Signs BP 125/83 (BP Location: Left Arm)   Pulse 84   Temp 98.6 F (37 C) (Oral)   Resp 18   Ht 1.803 m (5\' 11" )   Wt 135.2 kg   SpO2 96%   BMI 41.56 kg/m   Physical Exam Vitals signs and nursing note reviewed.  Constitutional:      General: He is not in acute distress.    Appearance: Normal appearance.  He is not toxic-appearing.  HENT:     Head: Normocephalic and atraumatic.     Mouth/Throat:     Mouth: Mucous membranes are moist.  Eyes:     Extraocular Movements: Extraocular movements intact.     Conjunctiva/sclera: Conjunctivae normal.     Pupils: Pupils are equal, round, and reactive to light.  Cardiovascular:     Rate and Rhythm: Normal rate and regular rhythm.     Pulses: Normal pulses.  Pulmonary:     Effort: Pulmonary effort is normal. No respiratory distress.     Breath sounds: Normal breath sounds. No wheezing or rales.  Abdominal:     General: Bowel sounds are normal.     Palpations: Abdomen is soft.     Tenderness: There is no abdominal tenderness.  Musculoskeletal:        General: Swelling and tenderness present.     Comments: Left lower extremity with some slight swelling.  And some erythema.  Cap refill is intact.  Skin:    General: Skin is warm.     Capillary Refill: Capillary refill takes less than 2 seconds.  Neurological:     General: No focal deficit present.     Mental Status: He is alert and oriented to person, place, and time.      ED Treatments / Results  Labs (all labs ordered are listed, but only abnormal results are displayed) Labs Reviewed  BASIC METABOLIC PANEL - Abnormal; Notable for the following components:      Result Value   CO2 21 (*)    Glucose, Bld 107 (*)    BUN 50 (*)    Creatinine, Ser 3.04 (*)    GFR calc non Af Amer 23 (*)    GFR calc Af Amer 26 (*)    All other components within normal limits  PROTIME-INR    EKG None  Radiology [place] Venous Img Lower Unilateral Left  Result Date: 01/03/2018 CLINICAL DATA:  Left calf pain.  History of DVT. EXAM: LEFT LOWER EXTREMITY VENOUS DOPPLER ULTRASOUND TECHNIQUE: Gray-scale sonography with graded compression, as well as color Doppler and duplex ultrasound were performed to evaluate the lower extremity deep venous systems from the level of the common femoral vein and including the  common femoral, femoral, profunda femoral, popliteal and calf veins including the posterior tibial, peroneal and gastrocnemius veins when visible. The superficial great saphenous vein was also interrogated. Spectral Doppler was utilized to evaluate flow at rest and with distal augmentation maneuvers in the common femoral, femoral and popliteal veins. COMPARISON:  None. FINDINGS: Contralateral Common Femoral Vein: Respiratory phasicity is normal and symmetric with the symptomatic side. No evidence of thrombus. Normal compressibility. Common Femoral Vein: No evidence of thrombus.  Normal compressibility, respiratory phasicity and response to augmentation. Saphenofemoral Junction: No evidence of thrombus. Normal compressibility and flow on color Doppler imaging. Profunda Femoral Vein: No evidence of thrombus. Normal compressibility and flow on color Doppler imaging. Femoral Vein: No evidence of thrombus. Normal compressibility, respiratory phasicity and response to augmentation. Popliteal Vein: Partially occlusive thrombus noted within the left popliteal vein. Calf Veins: Occlusive thrombus noted within 1 of the 2 posterior tibial veins and the peroneal vein. Superficial Great Saphenous Vein: No evidence of thrombus. Normal compressibility. Venous Reflux:  None. Other Findings:  None. IMPRESSION: Partially occlusive DVT in the left popliteal vein extending into the calf veins as above. Electronically Signed   By: Charlett Nose M.D.   On: 01/03/2018 09:40   Dg Foot Complete Right  Result Date: 01/02/2018 CLINICAL DATA:  Intermittent right big toe pain for the past 6-8 months. No known injury. EXAM: RIGHT FOOT COMPLETE - 3+ VIEW COMPARISON:  None. FINDINGS: Minimal spur formation at the 1st MTP joint laterally. Minimal spur formation at the articulation of the 1st metatarsal and medial cuneiform. Mild to moderate inferior calcaneal spur formation. Unremarkable soft tissues. IMPRESSION: Minimal degenerative changes, as  described above. Electronically Signed   By: Beckie Salts M.D.   On: 01/02/2018 19:13    Procedures Procedures (including critical care time)  Medications Ordered in ED Medications - No data to display   Initial Impression / Assessment and Plan / ED Course  I have reviewed the triage vital signs and the nursing notes.  Pertinent labs & imaging results that were available during my care of the patient were reviewed by me and considered in my medical decision making (see chart for details).    Doppler studies here today confirmed a popliteal tibial vein deep vein thrombosis.  Patient's renal function was poor yesterday.  Repeated today it is worse but based on his GFR discussed with the pharmacist.  They calculated his creatinine clearance and patient can have full dose of Xarelto.  Patient given prescription for Xarelto.  And sent to his pharmacy.  He will begin this he will follow-up with his vein specialist and his primary care doctor.  Will need close follow-up of his renal function as well.  Patient without any chest pain or shortness of breath.  Patient nontoxic no acute distress.   Final Clinical Impressions(s) / ED Diagnoses   Final diagnoses:  Acute deep vein thrombosis (DVT) of popliteal vein of left lower extremity [facility])    ED Discharge Orders         Ordered    Rivaroxaban 15 & 20 MG TBPK  2 times daily     01/03/18 1253           Vanetta Mulders, MD 01/03/18 1624

## 2018-01-03 NOTE — ED Notes (Signed)
Patient given discharge instruction, verbalized understand. Patient ambulatory out of the department with wife  

## 2018-01-03 NOTE — Discharge Instructions (Signed)
Follow-up with your primary care doctor as well as your vein specialist.  Take the Xarelto as directed.  Return for any chest pain or shortness of breath or any worsening symptoms.

## 2018-01-03 NOTE — ED Triage Notes (Signed)
Pt has an ultrasound of left lower leg,  Called today and was positive for DVT. Not taking any blood thinners

## 2018-01-03 NOTE — ED Notes (Signed)
Called EDP to bedside, pt is very upset, does not want labs and does not want to wait , done understand why he can not get a treatment plan and leave. States he waited to long yesterday and just getting answers today.  EDP took time and explain reasoning of labs and entire plan for his care.

## 2018-01-23 DIAGNOSIS — M109 Gout, unspecified: Secondary | ICD-10-CM | POA: Diagnosis not present

## 2018-01-23 DIAGNOSIS — N183 Chronic kidney disease, stage 3 (moderate): Secondary | ICD-10-CM | POA: Diagnosis not present

## 2018-01-23 DIAGNOSIS — I10 Essential (primary) hypertension: Secondary | ICD-10-CM | POA: Diagnosis not present

## 2018-01-23 DIAGNOSIS — R600 Localized edema: Secondary | ICD-10-CM | POA: Diagnosis not present

## 2018-01-29 DIAGNOSIS — I82403 Acute embolism and thrombosis of unspecified deep veins of lower extremity, bilateral: Secondary | ICD-10-CM | POA: Diagnosis not present

## 2018-01-29 DIAGNOSIS — I2699 Other pulmonary embolism without acute cor pulmonale: Secondary | ICD-10-CM | POA: Diagnosis not present

## 2018-01-29 DIAGNOSIS — I82409 Acute embolism and thrombosis of unspecified deep veins of unspecified lower extremity: Secondary | ICD-10-CM | POA: Diagnosis not present

## 2022-12-18 ENCOUNTER — Ambulatory Visit
Admission: EM | Admit: 2022-12-18 | Discharge: 2022-12-18 | Disposition: A | Discharge status: Home / Self Care | Payer: 59

## 2022-12-18 ENCOUNTER — Other Ambulatory Visit: Payer: Self-pay

## 2022-12-18 ENCOUNTER — Encounter: Payer: Self-pay | Admitting: Emergency Medicine

## 2022-12-18 DIAGNOSIS — M109 Gout, unspecified: Secondary | ICD-10-CM | POA: Diagnosis not present

## 2022-12-18 HISTORY — DX: Gout, unspecified: M10.9

## 2022-12-18 MED ORDER — DEXAMETHASONE SODIUM PHOSPHATE 10 MG/ML IJ SOLN
10.0000 mg | Freq: Once | INTRAMUSCULAR | Status: AC
  Administered 2022-12-18: 10 mg via INTRAMUSCULAR

## 2022-12-18 MED ORDER — PREDNISONE 20 MG PO TABS: 40.0000 mg | ORAL_TABLET | Freq: Every day | ORAL | 0 refills | Status: DC

## 2022-12-18 NOTE — ED Provider Notes (Signed)
[facility]    CSN: [id number] Arrival date & time: 12/18/22  7829      History   Chief Complaint Chief Complaint  Patient presents with   Foot Pain    HPI Ronald Gonzales is a 57 y.o. male.   Patient presenting today with redness, swelling, warmth, severe pain to bilateral feet, right foot at the great toe and left foot at the arch.  Longstanding history of gout issues and states this feels consistent.  States typically in the left foot when this happens it ends up in his ankle.  Trying over-the-counter pain relievers with no relief.  He states he tried allopurinol in the past with poor results, unable to take colchicine on a regular basis for flares due to chronic kidney disease.  Denies known injury, numbness, tingling, loss of range of motion, fevers, chills.    Past Medical History:  Diagnosis Date   CKD (chronic kidney disease) stage 3, GFR 30-59 ml/min (HCC)    DVT (deep venous thrombosis) (HCC)    Gout    Hypertension    april 2017   Pulmonary emboli [facility])     Patient Active Problem List   Diagnosis Date Noted   Leg edema, right 01/29/2017   DVT (deep venous thrombosis) (HCC) 11/28/2015   CKD (chronic kidney disease) stage 3, GFR 30-59 ml/min (HCC) 11/28/2015   Essential hypertension 11/28/2015   Elevated troponin 11/28/2015   Acute pulmonary embolism (HCC) 11/28/2015   Adjustment disorder with depressed mood 11/28/2015    Past Surgical History:  Procedure Laterality Date   dental implant     RENAL BIOPSY Left 09/21/2016       Home Medications    Prior to Admission medications   Medication Sig Start Date End Date Taking? Authorizing Provider  Dapagliflozin Propanediol (FARXIGA PO) Take by mouth.   Yes [provider]  predniSONE (DELTASONE) 20 MG tablet Take 2 tablets (40 mg total) by mouth daily with breakfast. 12/18/22  Yes Particia Nearing, PA-C  Flaxseed, Linseed, (FLAX SEED OIL PO) Take 1 capsule by mouth daily.     [provider]  lisinopril (PRINIVIL,ZESTRIL) 20 MG tablet Take 20 mg by mouth daily.    [provider]  losartan (COZAAR) 25 MG tablet Take 25 mg by mouth daily.    [provider]  predniSONE (DELTASONE) 10 MG tablet Take 6 tablets day one, 5 tablets day two, 4 tablets day three, 3 tablets day four, 2 tablets day five, then 1 tablet day six 01/02/18   Idol, Raynelle Fanning, PA-C  Rivaroxaban 15 & 20 MG TBPK Take 15 mg by mouth 2 (two) times daily. Take as directed on package: Start with one 15mg  tablet by mouth twice a day with food. On Day 22, switch to one 20mg  tablet once a day with food. 01/03/18   Vanetta Mulders, MD  traMADol (ULTRAM) 50 MG tablet Take 1 tablet (50 mg total) by mouth every 6 (six) hours as needed. 01/02/18   Burgess Amor, PA-C    Family History Family History  Problem Relation Age of Onset   CVA Father     Social History Social History   Tobacco Use   Smoking status: Never   Smokeless tobacco: Current    Types: Chew  Vaping Use   Vaping status: Never Used  Substance Use Topics   Alcohol use: Yes    Comment: occasional   Drug use: No     Allergies   Allopurinol  Review of Systems Review of Systems Per HPI  Physical Exam Triage Vital Signs ED Triage Vitals  Encounter Vitals Group     BP 12/18/22 0919 (!) 144/93     Systolic BP Percentile --      Diastolic BP Percentile --      Pulse Rate 12/18/22 0919 83     Resp 12/18/22 0919 20     Temp 12/18/22 0919 98.2 F (36.8 C)     Temp Source 12/18/22 0919 Oral     SpO2 12/18/22 0919 92 %     Weight --      Height --      Head Circumference --      Peak Flow --      Pain Score 12/18/22 0916 10     Pain Loc --      Pain Education --      Exclude from Growth Chart --    No data found.  Updated Vital Signs BP (!) 144/93 (BP Location: Right Arm)   Pulse 83   Temp 98.2 F (36.8 C) (Oral)   Resp 20   SpO2 92%   Visual Acuity Right Eye Distance:   Left Eye Distance:    Bilateral Distance:    Right Eye Near:   Left Eye Near:    Bilateral Near:     Physical Exam Vitals and nursing note reviewed.  Constitutional:      Appearance: Normal appearance.  HENT:     Head: Atraumatic.  Eyes:     Extraocular Movements: Extraocular movements intact.     Conjunctiva/sclera: Conjunctivae normal.  Cardiovascular:     Rate and Rhythm: Normal rate and regular rhythm.  Pulmonary:     Effort: Pulmonary effort is normal.     Breath sounds: Normal breath sounds.  Musculoskeletal:        General: Swelling and tenderness present. Normal range of motion.     Cervical back: Normal range of motion and neck supple.     Comments: Right great toe erythematous, edematous, tender to palpation.  Tenderness to palpation of the left arch  Skin:    General: Skin is warm and dry.     Findings: Erythema present.  Neurological:     General: No focal deficit present.     Mental Status: He is oriented to person, place, and time.     Comments: Bilateral lower extremities neurovascular intact  Psychiatric:        Mood and Affect: Mood normal.        Thought Content: Thought content normal.        Judgment: Judgment normal.      UC Treatments / Results  Labs (all labs ordered are listed, but only abnormal results are displayed) Labs Reviewed - No data to display  EKG   Radiology No results found.  Procedures Procedures (including critical care time)  Medications Ordered in UC Medications  dexamethasone (DECADRON) injection 10 mg (has no administration in time range)    Initial Impression / Assessment and Plan / UC Course  I have reviewed the triage vital signs and the nursing notes.  Pertinent labs & imaging results that were available during my care of the patient were reviewed by me and considered in my medical decision making (see chart for details).     Treat with IM Decadron, prednisone sent in case not resolving fully with the Decadron.  Discussed tart  cherry capsules daily and dietary modifications.  Follow-up with primary care  Final Clinical Impressions(s) / UC Diagnoses   Final diagnoses:  Acute gout of foot, unspecified cause, unspecified laterality     Discharge Instructions      Try tart cherry capsules daily, avoid dietary triggers, take the prednisone if the steroid shot is not resolving symptoms completely over the next few days.  Follow-up with primary care for recheck    ED Prescriptions     Medication Sig Dispense Auth. Provider   predniSONE (DELTASONE) 20 MG tablet Take 2 tablets (40 mg total) by mouth daily with breakfast. 10 tablet Particia Nearing, [place]      PDMP not reviewed this encounter.   Roosvelt Maser [place], [place] 12/18/22 [phone]

## 2022-12-18 NOTE — ED Triage Notes (Signed)
Pt reports history of gout and reports latest flare-up believed to have started on Saturday. Denies any known injury.reports bilateral foot pain and reports is having to use cane for stability since pain started.

## 2022-12-18 NOTE — Discharge Instructions (Signed)
Try tart cherry capsules daily, avoid dietary triggers, take the prednisone if the steroid shot is not resolving symptoms completely over the next few days.  Follow-up with primary care for recheck

## 2023-01-24 ENCOUNTER — Other Ambulatory Visit: Payer: Self-pay

## 2023-01-24 ENCOUNTER — Ambulatory Visit
Admission: EM | Admit: 2023-01-24 | Discharge: 2023-01-24 | Disposition: A | Discharge status: Home / Self Care | Payer: 59

## 2023-01-24 ENCOUNTER — Encounter: Payer: Self-pay | Admitting: Emergency Medicine

## 2023-01-24 DIAGNOSIS — M109 Gout, unspecified: Secondary | ICD-10-CM | POA: Diagnosis not present

## 2023-01-24 MED ORDER — DEXAMETHASONE SODIUM PHOSPHATE 10 MG/ML IJ SOLN
10.0000 mg | Freq: Once | INTRAMUSCULAR | Status: AC
  Administered 2023-01-24: 10 mg via INTRAMUSCULAR

## 2023-01-24 MED ORDER — PREDNISONE 10 MG PO TABS: ORAL_TABLET | ORAL | 0 refills | Status: DC

## 2023-01-24 NOTE — ED Provider Notes (Signed)
 [facility]    CSN: [id number] Arrival date & time: 01/24/23  0804      History   Chief Complaint Chief Complaint  Patient presents with   Ankle Pain    HPI Ronald Gonzales is a 58 y.o. male.   Presenting today with several day history of left ankle pain, redness, swelling.  History of gout, most commonly in this area.  Most recent flareup was about a month ago, treated with a Decadron  shot and prednisone  but states it took a while to fully go away even with the 5-day course of prednisone  and thinks it needs to be longer.  Has been trying tart cherry supplements and dietary changes with no relief.  Following up next week with primary care.    Past Medical History:  Diagnosis Date   CKD (chronic kidney disease) stage 3, GFR 30-59 ml/min (HCC)    DVT (deep venous thrombosis) (HCC)    Gout    Hypertension    april 2017   Pulmonary emboli [facility])     Patient Active Problem List   Diagnosis Date Noted   Leg edema, right 01/29/2017   DVT (deep venous thrombosis) (HCC) 11/28/2015   CKD (chronic kidney disease) stage 3, GFR 30-59 ml/min (HCC) 11/28/2015   Essential hypertension 11/28/2015   Elevated troponin 11/28/2015   Acute pulmonary embolism (HCC) 11/28/2015   Adjustment disorder with depressed mood 11/28/2015    Past Surgical History:  Procedure Laterality Date   dental implant     RENAL BIOPSY Left 09/21/2016       Home Medications    Prior to Admission medications   Medication Sig Start Date End Date Taking? Authorizing Provider  cherry syrup syrup Take by mouth once.   Yes [provider]  sodium bicarbonate 650 MG tablet Take 650 mg by mouth once.   Yes [provider]  Dapagliflozin Propanediol (FARXIGA PO) Take by mouth.    [provider]  Flaxseed, Linseed, (FLAX SEED OIL PO) Take 1 capsule by mouth daily.    [provider]  lisinopril (PRINIVIL,ZESTRIL) 20 MG tablet Take 20 mg by mouth daily.    [provider]  losartan (COZAAR) 25 MG tablet Take 25 mg by mouth daily.    [provider]  predniSONE  (DELTASONE ) 10 MG tablet Take 6 tablets daily for 2 days, 5 tablets daily for 2 days, 4 tablets daily for 2 days, etc 01/24/23   Stuart Vernell Norris, PA-C  predniSONE  (DELTASONE ) 20 MG tablet Take 2 tablets (40 mg total) by mouth daily with breakfast. 12/18/22   Stuart Vernell Norris, PA-C  Rivaroxaban  15 & 20 MG TBPK Take 15 mg by mouth 2 (two) times daily. Take as directed on package: Start with one 15mg  tablet by mouth twice a day with food. On Day 22, switch to one 20mg  tablet once a day with food. 01/03/18   Zackowski, Scott, MD  traMADol  (ULTRAM ) 50 MG tablet Take 1 tablet (50 mg total) by mouth every 6 (six) hours as needed. 01/02/18   Birdena Clarity, PA-C    Family History Family History  Problem Relation Age of Onset   CVA Father     Social History Social History   Tobacco Use   Smoking status: Never   Smokeless tobacco: Current    Types: Chew  Vaping Use   Vaping status: Never Used  Substance Use Topics   Alcohol use: Yes    Comment: occasional   Drug use: No  Allergies   Allopurinol   Review of Systems Review of Systems Per HPI  Physical Exam Triage Vital Signs ED Triage Vitals  Encounter Vitals Group     BP 01/24/23 0821 (!) 147/90     Systolic BP Percentile --      Diastolic BP Percentile --      Pulse Rate 01/24/23 0821 95     Resp 01/24/23 0821 20     Temp 01/24/23 0821 98.5 F (36.9 C)     Temp Source 01/24/23 0821 Oral     SpO2 01/24/23 0821 93 %     Weight --      Height --      Head Circumference --      Peak Flow --      Pain Score 01/24/23 0817 8     Pain Loc --      Pain Education --      Exclude from Growth Chart --    No data found.  Updated Vital Signs BP (!) 147/90 (BP Location: Right Arm)   Pulse 95   Temp 98.5 F (36.9 C) (Oral)   Resp 20   SpO2 93%   Visual Acuity Right Eye Distance:   Left Eye  Distance:   Bilateral Distance:    Right Eye Near:   Left Eye Near:    Bilateral Near:     Physical Exam Vitals and nursing note reviewed.  Constitutional:      Appearance: Normal appearance.  HENT:     Head: Atraumatic.  Eyes:     Extraocular Movements: Extraocular movements intact.     Conjunctiva/sclera: Conjunctivae normal.  Cardiovascular:     Rate and Rhythm: Normal rate and regular rhythm.  Pulmonary:     Effort: Pulmonary effort is normal.     Breath sounds: Normal breath sounds.  Musculoskeletal:        General: Swelling and tenderness present. No signs of injury.     Cervical back: Normal range of motion and neck supple.  Skin:    General: Skin is warm and dry.     Findings: Erythema present.  Neurological:     General: No focal deficit present.     Mental Status: He is oriented to person, place, and time.     Motor: No weakness.  Psychiatric:        Mood and Affect: Mood normal.        Thought Content: Thought content normal.        Judgment: Judgment normal.      UC Treatments / Results  Labs (all labs ordered are listed, but only abnormal results are displayed) Labs Reviewed - No data to display  EKG   Radiology No results found.  Procedures Procedures (including critical care time)  Medications Ordered in UC Medications  dexamethasone  (DECADRON ) injection 10 mg (10 mg Intramuscular Given 01/24/23 0845)    Initial Impression / Assessment and Plan / UC Course  I have reviewed the triage vital signs and the nursing notes.  Pertinent labs & imaging results that were available during my care of the patient were reviewed by me and considered in my medical decision making (see chart for details).     Treat with IM Decadron , extended prednisone  taper, tart cherry and dietary changes.  Follow-up with PCP for recheck.  Final Clinical Impressions(s) / UC Diagnoses   Final diagnoses:  Acute gout of left ankle, unspecified cause   Discharge  Instructions   None  ED Prescriptions     Medication Sig Dispense Auth. Provider   predniSONE  (DELTASONE ) 10 MG tablet Take 6 tablets daily for 2 days, 5 tablets daily for 2 days, 4 tablets daily for 2 days, etc 42 tablet Stuart Vernell Norris, [place]      PDMP not reviewed this encounter.   Stuart Vernell [place], [place] 01/24/23 [phone]

## 2023-01-24 NOTE — ED Triage Notes (Signed)
 Pt reports "gout flare-up." Reports left ankle pain that is also starting to get in right ankle.

## 2023-02-14 ENCOUNTER — Encounter: Payer: Self-pay | Admitting: Oncology

## 2023-02-14 ENCOUNTER — Inpatient Hospital Stay: Payer: 59

## 2023-02-14 ENCOUNTER — Inpatient Hospital Stay: Payer: 59 | Attending: Oncology | Admitting: Oncology

## 2023-02-14 VITALS — BP 155/94 | HR 104 | Temp 97.4°F | Resp 20 | Ht 71.0 in | Wt 318.2 lb

## 2023-02-14 DIAGNOSIS — R319 Hematuria, unspecified: Secondary | ICD-10-CM | POA: Insufficient documentation

## 2023-02-14 DIAGNOSIS — D72829 Elevated white blood cell count, unspecified: Secondary | ICD-10-CM | POA: Diagnosis present

## 2023-02-14 DIAGNOSIS — N189 Chronic kidney disease, unspecified: Secondary | ICD-10-CM | POA: Diagnosis not present

## 2023-02-14 DIAGNOSIS — D72825 Bandemia: Secondary | ICD-10-CM

## 2023-02-14 DIAGNOSIS — M109 Gout, unspecified: Secondary | ICD-10-CM | POA: Diagnosis not present

## 2023-02-14 NOTE — Progress Notes (Signed)
[facility] at [facility] HEMATOLOGY NEW VISIT  Assunta Found, MD  REASON FOR REFERRAL: Leukocytosis   HISTORY OF PRESENT ILLNESS: Ronald Gonzales 58 y.o. male referred for leukocytosis.  Patient has a history of chronic kidney disease and gout.  He was recently treated with steroids for gout flare, which he reports is a recurring issue.  The last he took the steroids was 2 days ago.  Patient had a gout flare for which he visited the ER on 01/24/2023 and was given a steroid taper for 5 days.  He saw his primary care on 01/28/2023 and had labs drawn which showed leukocytosis with neutrophil predominance.  Patient has no complaints today and has been doing well.  He denies weight loss, night sweats, fevers or chills, recent infections.  Stated that he was told he has mild hematuria and to see a urologist.  Patient is a non-smoker, occasionally drinks alcohol.  He works for [employer].  I have reviewed the past medical history, past surgical history, social history and family history with the patient   ALLERGIES:  is allergic to allopurinol.  MEDICATIONS:  Current Outpatient Medications  Medication Sig Dispense Refill   FARXIGA 10 MG TABS tablet Take 10 mg by mouth daily.     febuxostat (ULORIC) 40 MG tablet Take 40 mg by mouth daily.     Flaxseed, Linseed, (FLAX SEED OIL PO) Take 1 capsule by mouth daily.     sodium bicarbonate 650 MG tablet Take 650 mg by mouth once.     TART CHERRY PO Take by mouth.     XARELTO 20 MG TABS tablet Take 20 mg by mouth daily.     No current facility-administered medications for this visit.     REVIEW OF SYSTEMS:   Constitutional: Denies fevers, chills or night sweats Eyes: Denies blurriness of vision Ears, nose, mouth, throat, and face: Denies mucositis or sore throat Respiratory: Denies cough, dyspnea or wheezes Cardiovascular: Denies palpitation, chest discomfort or lower extremity swelling Gastrointestinal:  Denies nausea,  heartburn or change in bowel habits Skin: Denies abnormal skin rashes Lymphatics: Denies new lymphadenopathy or easy bruising Neurological:Denies numbness, tingling or new weaknesses Behavioral/Psych: Mood is stable, no new changes  All other systems were reviewed with the patient and are negative.  PHYSICAL EXAMINATION:   Vitals:   02/14/23 0958  BP: (!) 155/94  Pulse: (!) 104  Resp: 20  Temp: (!) 97.4 F (36.3 C)  SpO2: 97%    GENERAL:alert, no distress and comfortable LYMPH:  no palpable lymphadenopathy in the cervical, axillary or inguinal LUNGS: clear to auscultation and percussion with normal breathing effort HEART: regular rate & rhythm and no murmurs and no lower extremity edema ABDOMEN:abdomen soft, non-tender and normal bowel sounds Musculoskeletal:no cyanosis of digits and no clubbing  NEURO: alert & oriented x 3 with fluent speech  LABORATORY DATA:  I have reviewed the data as listed and labs from primary care on 01/29/2023 are as follows: CMP: Creatinine: 2.41, potassium: 4.6 CBC: WBC: 14.2, hemoglobin: 16.2, hematocrit: 52.8, MCV: 86, platelets: 374 ANC: 11.2, lymphocytes: 1.8 Reticulocyte count: 1.6 Vitamin B12: 459, folate: 6.7, ferritin: 89, TIBC: 322, iron: 111, percent sat: 34 Lab Results  Component Value Date   WBC 9.7 01/02/2018   NEUTROABS 6.4 01/02/2018   HGB 14.0 01/02/2018   HCT 45.7 01/02/2018   MCV 93.5 01/02/2018   PLT 224 01/02/2018      Component Value Date/Time   NA 136 01/03/2018 1105  K 5.0 01/03/2018 1105   CL 105 01/03/2018 1105   CO2 21 (L) 01/03/2018 1105   GLUCOSE 107 (H) 01/03/2018 1105   BUN 50 (H) 01/03/2018 1105   CREATININE 3.04 (H) 01/03/2018 1105   CALCIUM 9.6 01/03/2018 1105   GFRNONAA 23 (L) 01/03/2018 1105   GFRAA 26 (L) 01/03/2018 1105     Chemistry      Component Value Date/Time   NA 136 01/03/2018 1105   K 5.0 01/03/2018 1105   CL 105 01/03/2018 1105   CO2 21 (L) 01/03/2018 1105   BUN 50 (H) 01/03/2018  1105   CREATININE 3.04 (H) 01/03/2018 1105      Component Value Date/Time   CALCIUM 9.6 01/03/2018 1105      ASSESSMENT & PLAN:  Patient is a 58 year old male with chronic kidney disease referred for recent leukocytosis.  Leukocytosis Recent steroid use for gout flare and ongoing inflammation likely contributing to mild leukocytosis. No signs of infection, malignancy, or other B symptoms. -Repeat CBC in two weeks off steroids to reassess WBC count.  If patient has persistent leukocytosis off of steroids, would proceed with further evaluation including a flow cytometry   Orders Placed This Encounter  Procedures   CBC with Differential/Platelet    Standing Status:   Future    Expected Date:   02/28/2023    Expiration Date:   02/14/2024    The total time spent in the appointment was 30 minutes encounter with patients including review of chart and various tests results, discussions about plan of care and coordination of care plan   All questions were answered. The patient knows to call the clinic with any problems, questions or concerns. No barriers to learning was detected.   Cindie Crumbly, MD 1/23/20255:05 PM

## 2023-02-14 NOTE — Assessment & Plan Note (Addendum)
Recent steroid use for gout flare and ongoing inflammation likely contributing to mild leukocytosis. No signs of infection, malignancy, or other B symptoms. -Repeat CBC in two weeks off steroids to reassess WBC count.  If patient has persistent leukocytosis off of steroids, would proceed with further evaluation including a flow cytometry

## 2023-02-14 NOTE — Patient Instructions (Signed)
VISIT SUMMARY:  You were seen today to evaluate an elevated white blood cell count. We discussed your recent treatment for a gout flare and your ongoing management for chronic kidney disease. We also reviewed your recent start on Uloric for gout and the trace amounts of blood in your urine.  YOUR PLAN:  -ELEVATED WHITE BLOOD CELL COUNT: Your recent use of steroids for a gout flare and ongoing inflammation are likely causing a mild increase in your white blood cell count. There are no signs of infection or other serious issues. We will repeat your blood test in two weeks after you have stopped taking steroids to reassess your white blood cell count.   INSTRUCTIONS:  Please repeat your complete blood count (CBC) in two weeks after you have stopped taking steroids to reassess your white blood cell count. Continue with your planned urology referral for the trace amounts of blood in your urine.

## 2023-02-25 NOTE — Progress Notes (Signed)
 Name: Ronald Gonzales DOB: 30-Jul-1965 MRN: [id number]  History of Present Illness: Mr. Engelmann is a 58 y.o. male who presents today as a new patient at [facility] [place].  - GU History: 1. Erectile dysfunction. - Takes Cialis PRN. 2. CKD stage 4. - 01/29/2023: GFR 31, creatinine 2.41. - He reports being established with Nephrology (Dr. Almarie Bonine).  He reports chief complaint of microscopic hematuria. > 01/28/2023: UA showed glucosuria (500), small blood, protein (>300), no nitrites or leukocytes.  Today: He reports that over the past 3-4 weeks he has had increased urinary frequency. Denies dysuria, hesitancy, straining to void, or sensations of incomplete emptying. He denies flank pain or abdominal pain; reports midline low back pain x1 week.  He denies prior history of gross hematuria.  He denies history of pyelonephritis.  He denies history of recent or recurrent UTI. He denies history of GU malignancy or pelvic radiation.  He reports current nicotine use - has chewed tobacco (3-5 oz daily) for approximately 44 years. He denies recent vigorous exercise which they think may be contributory to hematuria. He denies any recent trauma or prolonged pressure to the perineal area. He denies recent illness. He reports taking anticoagulants (Xarelto ).   Fall Screening: Do you usually have a device to assist in your mobility? No   Medications: Current Outpatient Medications  Medication Sig Dispense Refill   FARXIGA 10 MG TABS tablet Take 10 mg by mouth daily.     febuxostat (ULORIC) 40 MG tablet Take 40 mg by mouth daily.     Flaxseed, Linseed, (FLAX SEED OIL PO) Take 1 capsule by mouth daily.     sodium bicarbonate 650 MG tablet Take 650 mg by mouth once.     TART CHERRY PO Take by mouth.     XARELTO  20 MG TABS tablet Take 20 mg by mouth daily.     No current facility-administered medications for this visit.    Allergies: Allergies  Allergen Reactions    Allopurinol     Reports gives me a gout flare-up.    Past Medical History:  Diagnosis Date   CKD (chronic kidney disease) stage 3, GFR 30-59 ml/min (HCC)    DVT (deep venous thrombosis) (HCC)    Gout    Hypertension    april 2017   Pulmonary emboli [facility])    Past Surgical History:  Procedure Laterality Date   dental implant     RENAL BIOPSY Left 09/21/2016   Family History  Problem Relation Age of Onset   CVA Father    Social History   Socioeconomic History   Marital status: Married    Spouse name: Not on file   Number of children: Not on file   Years of education: Not on file   Highest education level: Not on file  Occupational History   Occupation: acupuncturist  Tobacco Use   Smoking status: Never   Smokeless tobacco: Current    Types: Chew  Vaping Use   Vaping status: Never Used  Substance and Sexual Activity   Alcohol use: Yes    Comment: occasional   Drug use: No   Sexual activity: Not on file  Other Topics Concern   Not on file  Social History Narrative   Not on file   Social Drivers of Health   Financial Resource Strain: Not on file  Food Insecurity: No Food Insecurity (02/14/2023)   Hunger Vital Sign    Worried About Running Out of Food in the  Last Year: Never true    Ran Out of Food in the Last Year: Never true  Transportation Needs: No Transportation Needs (02/14/2023)   PRAPARE - Administrator, Civil Service (Medical): No    Lack of Transportation (Non-Medical): No  Physical Activity: Not on file  Stress: Not on file  Social Connections: Not on file  Intimate Partner Violence: Not At Risk (02/14/2023)   Humiliation, Afraid, Rape, and Kick questionnaire    Fear of Current or Ex-Partner: No    Emotionally Abused: No    Physically Abused: No    Sexually Abused: No    SUBJECTIVE  Review of Systems Constitutional: Patient denies any unintentional weight loss or change in strength lntegumentary: Patient denies any rashes  or pruritus Cardiovascular: Patient denies chest pain or syncope Respiratory: Patient denies shortness of breath Gastrointestinal: Patient denies nausea, vomiting, or diarrhea; reports intermittent constipation Musculoskeletal: Patient denies muscle cramps or weakness Neurologic: Patient denies convulsions or seizures Allergic/Immunologic: Patient denies recent allergic reaction(s) Hematologic/Lymphatic: Patient denies bleeding tendencies Endocrine: Patient denies heat/cold intolerance  GU: As per HPI.  OBJECTIVE Vitals:   03/01/23 1119  BP: (!) 156/95  Pulse: 92   There is no height or weight on file to calculate BMI.  Physical Examination Constitutional: No obvious distress; patient is non-toxic appearing  Cardiovascular: No visible lower extremity edema.  Respiratory: The patient does not have audible wheezing/stridor; respirations do not appear labored  Gastrointestinal: Abdomen non-distended Musculoskeletal: Normal ROM of UEs  Skin: No obvious rashes/open sores  Neurologic: CN 2-12 grossly intact Psychiatric: Answered questions appropriately with normal affect  Hematologic/Lymphatic/Immunologic: No obvious bruises or sites of spontaneous bleeding  Urine microscopy: 3-10 RBC/hpf with no evidence of UTI; glucosuria (secondary to Farxiga use)  PVR: 3 ml  ASSESSMENT Microscopic hematuria - Plan: Urinalysis, Routine w reflex microscopic, BLADDER SCAN AMB NON-IMAGING, CT HEMATURIA WORKUP  Nicotine use - Plan: Urinalysis, Routine w reflex microscopic, BLADDER SCAN AMB NON-IMAGING, CT HEMATURIA WORKUP  Chews tobacco - Plan: CT HEMATURIA WORKUP  For asymptomatic microscopic hematuria we discussed possible etiologies including but not limited to: vigorous exercise, sexual activity, stone, trauma, blood thinner use, urinary tract infection, urethral irritation secondary to chronic kidney disease, glomerulonephropathy, BPH, malignancy. We discussed pt's nicotine use as a risk  factor for GU cancer and encouraged continued cessation.  We reviewed the AUA 2020 [facility] guideline and risk stratification for this patient. Suspect nephropathy as likely etiology of his microscopic hematuria based on proteinuria, however based patient risk factors (including age and nicotine use) advised further evaluation with cystoscopy and CT urogram.   Advised to continue routine follow up with Nephrology specialist for his CKD stage 4.  His urinary frequency may be secondary to Farxiga use and/or BPH.  Patient decided to pursue this work-up and follow-up afterward to discuss the results and formulate a treatment plan based on the findings. All questions were answered.  PLAN Advised the following: CT ordered. 2. Return for 1st available cystoscopy with any urology MD.  Orders Placed This Encounter  Procedures   CT HEMATURIA WORKUP    Standing Status:   Future    Expiration Date:   02/29/2024    Reason for Exam (SYMPTOM  OR DIAGNOSIS REQUIRED):   Microscopic hematuria    Preferred imaging location?:   [facility]   Urinalysis, Routine w reflex microscopic   BLADDER SCAN AMB NON-IMAGING    It has been explained that the patient is to follow regularly with their  PCP in addition to all other providers involved in their care and to follow instructions provided by these respective offices. Patient advised to contact urology clinic if any urologic-pertaining questions, concerns, new symptoms or problems arise in the interim period.  There are no Patient Instructions on file for this visit.  Electronically signed by:  Lauraine KYM Oz, MSN, FNP-C, CUNP 03/01/2023 11:59 AM

## 2023-02-28 ENCOUNTER — Inpatient Hospital Stay: Payer: 59 | Admitting: Oncology

## 2023-02-28 ENCOUNTER — Inpatient Hospital Stay: Payer: 59 | Attending: Oncology

## 2023-02-28 VITALS — BP 159/97 | HR 84 | Temp 97.2°F | Resp 20 | Wt 317.7 lb

## 2023-02-28 DIAGNOSIS — E669 Obesity, unspecified: Secondary | ICD-10-CM | POA: Insufficient documentation

## 2023-02-28 DIAGNOSIS — E66813 Obesity, class 3: Secondary | ICD-10-CM

## 2023-02-28 DIAGNOSIS — Z6841 Body Mass Index (BMI) 40.0 and over, adult: Secondary | ICD-10-CM | POA: Insufficient documentation

## 2023-02-28 DIAGNOSIS — M109 Gout, unspecified: Secondary | ICD-10-CM | POA: Insufficient documentation

## 2023-02-28 DIAGNOSIS — D72825 Bandemia: Secondary | ICD-10-CM

## 2023-02-28 DIAGNOSIS — Z79899 Other long term (current) drug therapy: Secondary | ICD-10-CM | POA: Diagnosis not present

## 2023-02-28 DIAGNOSIS — D72829 Elevated white blood cell count, unspecified: Secondary | ICD-10-CM | POA: Diagnosis present

## 2023-02-28 DIAGNOSIS — D72828 Other elevated white blood cell count: Secondary | ICD-10-CM

## 2023-02-28 LAB — CBC WITH DIFFERENTIAL/PLATELET
Abs Immature Granulocytes: 0.02 10*3/uL (ref 0.00–0.07)
Basophils Absolute: 0.1 10*3/uL (ref 0.0–0.1)
Basophils Relative: 1 %
Eosinophils Absolute: 0.1 10*3/uL (ref 0.0–0.5)
Eosinophils Relative: 2 %
HCT: 48.4 % (ref 39.0–52.0)
Hemoglobin: 15.3 g/dL (ref 13.0–17.0)
Immature Granulocytes: 0 %
Lymphocytes Relative: 20 %
Lymphs Abs: 1.3 10*3/uL (ref 0.7–4.0)
MCH: 27.4 pg (ref 26.0–34.0)
MCHC: 31.6 g/dL (ref 30.0–36.0)
MCV: 86.7 fL (ref 80.0–100.0)
Monocytes Absolute: 0.8 10*3/uL (ref 0.1–1.0)
Monocytes Relative: 12 %
Neutro Abs: 4.3 10*3/uL (ref 1.7–7.7)
Neutrophils Relative %: 65 %
Platelets: 273 10*3/uL (ref 150–400)
RBC: 5.58 MIL/uL (ref 4.22–5.81)
RDW: 14.1 % (ref 11.5–15.5)
WBC: 6.7 10*3/uL (ref 4.0–10.5)
nRBC: 0 % (ref 0.0–0.2)

## 2023-02-28 NOTE — Assessment & Plan Note (Signed)
 WBC count previously 14.2.  Likely secondary to steroid use at that time.  Repeat CBC today showed normal WBC. -No further action required at this time and no follow-up needed. -Continue to follow with primary care for healthcare needs.

## 2023-02-28 NOTE — Assessment & Plan Note (Signed)
 Patient has a BMI of 44.31.  Patient had Ozempic prescribed by his nephrologist and is working on losing weight. -Encouraged to continue efforts towards weight loss.

## 2023-02-28 NOTE — Progress Notes (Signed)
 [facility] at [facility] HEMATOLOGY FOLLOW-UP VISIT  Marvine Rush, MD  REASON FOR FOLLOW-UP: Leukocytosis  ASSESSMENT & PLAN:  Patient is a 58 year old male with past medical history of gout following for leukocytosis   Leukocytosis WBC count previously 14.2.  Likely secondary to steroid use at that time.  Repeat CBC today showed normal WBC. -No further action required at this time and no follow-up needed. -Continue to follow with primary care for healthcare needs.  Obesity Patient has a BMI of 44.31.  Patient had Ozempic prescribed by his nephrologist and is working on losing weight. -Encouraged to continue efforts towards weight loss.  Gout Patient had a recent acute treated with steroids. -No acute issues at this time.   The total time spent in the appointment was 15 minutes encounter with patients including review of chart and various tests results, discussions about plan of care and coordination of care plan   All questions were answered. The patient knows to call the clinic with any problems, questions or concerns. No barriers to learning was detected.  Mickiel Dry, MD 2/6/202510:06 AM    INTERVAL HISTORY: Ronald Gonzales 58 y.o. male is following for leukocytosis.  Patient has no complaints today and has been doing really well.  Previously he was on a short course of steroids for gout flare and completed it 2 weeks ago.  Repeat CBC from today showed normal white blood cell count.  Patient denies fever, chills, weight loss, loss of appetite, night sweats, abdominal pain at this time.   I have reviewed the past medical history, past surgical history, social history and family history with the patient   ALLERGIES:  is allergic to allopurinol.  MEDICATIONS:  Current Outpatient Medications  Medication Sig Dispense Refill   FARXIGA 10 MG TABS tablet Take 10 mg by mouth daily.     febuxostat (ULORIC) 40 MG tablet Take 40 mg by mouth daily.      Flaxseed, Linseed, (FLAX SEED OIL PO) Take 1 capsule by mouth daily.     sodium bicarbonate 650 MG tablet Take 650 mg by mouth once.     TART CHERRY PO Take by mouth.     XARELTO  20 MG TABS tablet Take 20 mg by mouth daily.     No current facility-administered medications for this visit.     REVIEW OF SYSTEMS:   Constitutional: Denies fevers, chills or night sweats Eyes: Denies blurriness of vision Ears, nose, mouth, throat, and face: Denies mucositis or sore throat Respiratory: Denies cough, dyspnea or wheezes Cardiovascular: Denies palpitation, chest discomfort or lower extremity swelling Gastrointestinal:  Denies nausea, heartburn or change in bowel habits Skin: Denies abnormal skin rashes Lymphatics: Denies new lymphadenopathy or easy bruising Neurological:Denies numbness, tingling or new weaknesses Behavioral/Psych: Mood is stable, no new changes  All other systems were reviewed with the patient and are negative.  PHYSICAL EXAMINATION:   Vitals:   02/28/23 0938  BP: (!) 159/97  Pulse: 84  Resp: 20  Temp: (!) 97.2 F (36.2 C)  SpO2: 95%    GENERAL:alert, no distress and comfortable LYMPH:  no palpable lymphadenopathy in the cervical, axillary or inguinal LUNGS: clear to auscultation and percussion with normal breathing effort HEART: regular rate & rhythm and no murmurs and no lower extremity edema ABDOMEN:abdomen soft, non-tender and normal bowel sounds, limited by obesity. Musculoskeletal:no cyanosis of digits and no clubbing  NEURO: alert & oriented x 3 with fluent speech  LABORATORY DATA:  I have reviewed  the data as listed   Latest Reference Range & Units 02/28/23 09:05  WBC 4.0 - 10.5 K/uL 6.7  RBC 4.22 - 5.81 MIL/uL 5.58  Hemoglobin 13.0 - 17.0 g/dL 84.6  HCT 60.9 - 47.9 % 48.4  MCV 80.0 - 100.0 fL 86.7  MCH 26.0 - 34.0 pg 27.4  MCHC 30.0 - 36.0 g/dL 68.3  RDW 88.4 - 84.4 % 14.1  Platelets 150 - 400 K/uL 273  nRBC 0.0 - 0.2 % 0.0  Neutrophils % 65   Lymphocytes % 20  Monocytes Relative % 12  Eosinophil % 2  Basophil % 1  Immature Granulocytes % 0  NEUT# 1.7 - 7.7 K/uL 4.3  Lymphs Abs 0.7 - 4.0 K/uL 1.3  Monocyte # 0.1 - 1.0 K/uL 0.8  Eosinophils Absolute 0.0 - 0.5 K/uL 0.1  Basophils Absolute 0.0 - 0.1 K/uL 0.1  Abs Immature Granulocytes 0.00 - 0.07 K/uL 0.02  (H): Data is abnormally high

## 2023-02-28 NOTE — Assessment & Plan Note (Signed)
 Patient had a recent acute treated with steroids. -No acute issues at this time.

## 2023-02-28 NOTE — Patient Instructions (Signed)
 VISIT SUMMARY:  During today's visit, we discussed your recent elevated white blood cell count, your concerns about obesity, and your history of gout. We also reviewed your general health maintenance and follow-up plans.  YOUR PLAN:  -ELEVATED WHITE BLOOD CELL COUNT: Your white blood cell count was previously elevated, likely due to the steroid treatment for gout. Your current count is now within normal limits at 6.7, so no further action is needed at this time.  -OBESITY: Obesity can impact your overall health and may contribute to elevated white blood cell counts. We discussed the importance of continuing your efforts towards weight loss.  -GOUT: Gout is a form of arthritis characterized by sudden, severe attacks of pain, swelling, and redness in the joints. Your recent episode was treated with steroids, and there are no acute issues at this time.   INSTRUCTIONS:   No further oncology follow-up is required at this time.

## 2023-03-01 ENCOUNTER — Encounter: Payer: Self-pay | Admitting: Urology

## 2023-03-01 ENCOUNTER — Ambulatory Visit (INDEPENDENT_AMBULATORY_CARE_PROVIDER_SITE_OTHER): Payer: 59 | Admitting: Urology

## 2023-03-01 VITALS — BP 156/95 | HR 92

## 2023-03-01 DIAGNOSIS — R3129 Other microscopic hematuria: Secondary | ICD-10-CM

## 2023-03-01 DIAGNOSIS — Z720 Tobacco use: Secondary | ICD-10-CM | POA: Diagnosis not present

## 2023-03-01 LAB — URINALYSIS, ROUTINE W REFLEX MICROSCOPIC
Bilirubin, UA: NEGATIVE
Ketones, UA: NEGATIVE
Leukocytes,UA: NEGATIVE
Nitrite, UA: NEGATIVE
Specific Gravity, UA: 1.025 (ref 1.005–1.030)
Urobilinogen, Ur: 0.2 mg/dL (ref 0.2–1.0)
pH, UA: 6 (ref 5.0–7.5)

## 2023-03-01 LAB — MICROSCOPIC EXAMINATION: Bacteria, UA: NONE SEEN

## 2023-03-01 LAB — BLADDER SCAN AMB NON-IMAGING: Scan Result: 3

## 2023-03-17 ENCOUNTER — Ambulatory Visit
Admission: EM | Admit: 2023-03-17 | Discharge: 2023-03-17 | Disposition: A | Discharge status: Home / Self Care | Payer: 59 | Attending: Family Medicine | Admitting: Family Medicine

## 2023-03-17 DIAGNOSIS — M109 Gout, unspecified: Secondary | ICD-10-CM

## 2023-03-17 DIAGNOSIS — R030 Elevated blood-pressure reading, without diagnosis of hypertension: Secondary | ICD-10-CM

## 2023-03-17 MED ORDER — DEXAMETHASONE SODIUM PHOSPHATE 10 MG/ML IJ SOLN
10.0000 mg | Freq: Once | INTRAMUSCULAR | Status: AC
  Administered 2023-03-17: 10 mg via INTRAMUSCULAR

## 2023-03-17 MED ORDER — PREDNISONE 20 MG PO TABS: 40.0000 mg | ORAL_TABLET | Freq: Every day | ORAL | 1 refills | Status: DC

## 2023-03-17 NOTE — ED Triage Notes (Signed)
 Pt reports possible left foot gout states this is the 3 mo he has been in here. States he is unable to put pressure on the foot, pain has gotten progressively worse since Wednesday of last week.

## 2023-03-17 NOTE — ED Provider Notes (Signed)
 [facility]    CSN: [id number] Arrival date & time: 03/17/23  0865      History   Chief Complaint No chief complaint on file.   HPI Ronald Gonzales is a 58 y.o. male.   Presenting today with a gout flare for the past week or so to the left foot.  Redness, swelling, severe pain and inability to bear weight.  No known injury, fever, chills, chest pain, shortness of breath, palpitations.  Has a long history of issues with gout, was started on Uloric several weeks ago and has been having multiple gout flares since starting.  He is also concerned about elevated blood pressure readings in office today.  Denies chest pain, dizziness, headache.    Past Medical History:  Diagnosis Date   CKD (chronic kidney disease) stage 3, GFR 30-59 ml/min (HCC)    DVT (deep venous thrombosis) (HCC)    Gout    Hypertension    april 2017   Pulmonary emboli [facility])     Patient Active Problem List   Diagnosis Date Noted   Chews tobacco 03/01/2023   Obesity 02/28/2023   Gout 02/28/2023   Leukocytosis 02/14/2023   Leg edema, right 01/29/2017   DVT (deep venous thrombosis) (HCC) 11/28/2015   CKD (chronic kidney disease) stage 3, GFR 30-59 ml/min (HCC) 11/28/2015   Essential hypertension 11/28/2015   Elevated troponin 11/28/2015   Acute pulmonary embolism (HCC) 11/28/2015   Adjustment disorder with depressed mood 11/28/2015    Past Surgical History:  Procedure Laterality Date   dental implant     RENAL BIOPSY Left 09/21/2016       Home Medications    Prior to Admission medications   Medication Sig Start Date End Date Taking? Authorizing Provider  predniSONE (DELTASONE) 20 MG tablet Take 2 tablets (40 mg total) by mouth daily with breakfast. 03/17/23  Yes Particia Nearing, PA-C  FARXIGA 10 MG TABS tablet Take 10 mg by mouth daily. 01/28/23   [provider]  febuxostat (ULORIC) 40 MG tablet Take 40 mg by mouth daily. 01/28/23   [provider]  Flaxseed,  Linseed, (FLAX SEED OIL PO) Take 1 capsule by mouth daily.    [provider]  sodium bicarbonate 650 MG tablet Take 650 mg by mouth once.    [provider]  TART CHERRY PO Take by mouth.    [provider]  XARELTO 20 MG TABS tablet Take 20 mg by mouth daily. 02/08/23   [provider]    Family History Family History  Problem Relation Age of Onset   CVA Father     Social History Social History   Tobacco Use   Smoking status: Never   Smokeless tobacco: Current    Types: Chew  Vaping Use   Vaping status: Never Used  Substance Use Topics   Alcohol use: Yes    Comment: occasional   Drug use: No     Allergies   Allopurinol   Review of Systems Review of Systems Per HPI  Physical Exam Triage Vital Signs ED Triage Vitals  Encounter Vitals Group     BP 03/17/23 1133 (!) 167/106     Systolic BP Percentile --      Diastolic BP Percentile --      Pulse Rate 03/17/23 1133 (!) 105     Resp 03/17/23 1133 16     Temp 03/17/23 1133 98.3 F (36.8 C)     Temp Source 03/17/23 1133 Oral  SpO2 03/17/23 1133 93 %     Weight --      Height --      Head Circumference --      Peak Flow --      Pain Score 03/17/23 1137 6     Pain Loc --      Pain Education --      Exclude from Growth Chart --    No data found.  Updated Vital Signs BP (!) 167/106 (BP Location: Right Arm)   Pulse (!) 105   Temp 98.3 F (36.8 C) (Oral)   Resp 16   SpO2 93%   Visual Acuity Right Eye Distance:   Left Eye Distance:   Bilateral Distance:    Right Eye Near:   Left Eye Near:    Bilateral Near:     Physical Exam Vitals and nursing note reviewed.  Constitutional:      Appearance: Normal appearance.  HENT:     Head: Atraumatic.  Eyes:     Extraocular Movements: Extraocular movements intact.     Conjunctiva/sclera: Conjunctivae normal.  Cardiovascular:     Rate and Rhythm: Normal rate.  Pulmonary:     Effort: Pulmonary effort is normal.   Musculoskeletal:        General: Swelling and tenderness present.     Cervical back: Normal range of motion and neck supple.     Comments: Left foot severely tender to palpation, mildly erythematous, noticed  Skin:    General: Skin is warm and dry.     Findings: Erythema present.  Neurological:     General: No focal deficit present.     Mental Status: He is oriented to person, place, and time.     Comments: Bilateral lower extremities neurovascularly intact  Psychiatric:        Mood and Affect: Mood normal.        Thought Content: Thought content normal.        Judgment: Judgment normal.      UC Treatments / Results  Labs (all labs ordered are listed, but only abnormal results are displayed) Labs Reviewed - No data to display  EKG   Radiology No results found.  Procedures Procedures (including critical care time)  Medications Ordered in UC Medications  dexamethasone (DECADRON) injection 10 mg (10 mg Intramuscular Given 03/17/23 1220)    Initial Impression / Assessment and Plan / UC Course  I have reviewed the triage vital signs and the nursing notes.  Pertinent labs & imaging results that were available during my care of the patient were reviewed by me and considered in my medical decision making (see chart for details).     Treat with IM Decadron, prednisone for acute gout flare as this is what typically works best for him.  He is requesting a refill as he is having flares and starting the Uloric so 1 refill provided but discussed that he would need to follow-up if needing any further treatment.  Follow-up with PCP for recheck.  Regarding elevated blood pressure readings, possibly secondary to pain but discussed close monitoring at home and following up with PCP regarding this as well.  Continue current regimen.  Final Clinical Impressions(s) / UC Diagnoses   Final diagnoses:  Acute gout of left foot, unspecified cause  Elevated blood pressure reading    Discharge Instructions   None    ED Prescriptions     Medication Sig Dispense Auth. Provider   predniSONE (DELTASONE) 20 MG tablet Take 2 tablets (  40 mg total) by mouth daily with breakfast. 10 tablet Particia Nearing, [place]      PDMP not reviewed this encounter.   Particia Nearing, [place] 03/17/23 1222

## 2023-03-28 ENCOUNTER — Ambulatory Visit (HOSPITAL_BASED_OUTPATIENT_CLINIC_OR_DEPARTMENT_OTHER): Payer: 59

## 2023-03-29 ENCOUNTER — Telehealth: Payer: Self-pay

## 2023-03-29 MED ORDER — PREDNISONE 20 MG PO TABS: 40.0000 mg | ORAL_TABLET | Freq: Every day | ORAL | 1 refills | Status: DC

## 2023-04-10 ENCOUNTER — Other Ambulatory Visit: Payer: 59 | Admitting: Urology

## 2023-05-10 ENCOUNTER — Ambulatory Visit
Admission: EM | Admit: 2023-05-10 | Discharge: 2023-05-10 | Disposition: A | Discharge status: Home / Self Care | Attending: Family Medicine | Admitting: Family Medicine

## 2023-05-10 DIAGNOSIS — M79672 Pain in left foot: Secondary | ICD-10-CM | POA: Diagnosis not present

## 2023-05-10 DIAGNOSIS — R030 Elevated blood-pressure reading, without diagnosis of hypertension: Secondary | ICD-10-CM

## 2023-05-10 MED ORDER — PREDNISONE 20 MG PO TABS: 40.0000 mg | ORAL_TABLET | Freq: Every day | ORAL | 0 refills | Status: DC

## 2023-05-10 NOTE — ED Triage Notes (Signed)
 Pt reports he has left foot swelling and pain x 1.5 weeks . Pain when walking

## 2023-05-10 NOTE — ED Provider Notes (Signed)
 [facility]    CSN: [id number] Arrival date & time: 05/10/23  1704      History   Chief Complaint No chief complaint on file.   HPI Ronald Gonzales is a 58 y.o. male.   Patient presenting today with 1-1/2 weeks of initially left heel pain and swelling that he thought was his Achilles tendon, now with pain and swelling down at the toes and ball of foot which he states feels more like his typical gout flares in this foot.  Denies redness, significant swelling, injury to the area, numbness, tingling, loss of range of motion.  So far trying ice, over-the-counter pain relievers with minimal relief.  History of frequent gout flares in this foot.    Past Medical History:  Diagnosis Date   CKD (chronic kidney disease) stage 3, GFR 30-59 ml/min (HCC)    DVT (deep venous thrombosis) (HCC)    Gout    Hypertension    april 2017   Pulmonary emboli [facility])     Patient Active Problem List   Diagnosis Date Noted   Chews tobacco 03/01/2023   Obesity 02/28/2023   Gout 02/28/2023   Leukocytosis 02/14/2023   Leg edema, right 01/29/2017   DVT (deep venous thrombosis) (HCC) 11/28/2015   CKD (chronic kidney disease) stage 3, GFR 30-59 ml/min (HCC) 11/28/2015   Essential hypertension 11/28/2015   Elevated troponin 11/28/2015   Acute pulmonary embolism (HCC) 11/28/2015   Adjustment disorder with depressed mood 11/28/2015    Past Surgical History:  Procedure Laterality Date   dental implant     RENAL BIOPSY Left 09/21/2016       Home Medications    Prior to Admission medications   Medication Sig Start Date End Date Taking? Authorizing Provider  FARXIGA 10 MG TABS tablet Take 10 mg by mouth daily. 01/28/23   [provider]  febuxostat (ULORIC) 40 MG tablet Take 40 mg by mouth daily. 01/28/23   [provider]  Flaxseed, Linseed, (FLAX SEED OIL PO) Take 1 capsule by mouth daily.    [provider]  predniSONE  (DELTASONE ) 20 MG tablet Take 2 tablets  (40 mg total) by mouth daily with breakfast. 05/10/23   Stuart Vernell Norris, PA-C  sodium bicarbonate 650 MG tablet Take 650 mg by mouth once.    [provider]  TART CHERRY PO Take by mouth.    [provider]  XARELTO  20 MG TABS tablet Take 20 mg by mouth daily. 02/08/23   [provider]    Family History Family History  Problem Relation Age of Onset   CVA Father     Social History Social History   Tobacco Use   Smoking status: Never   Smokeless tobacco: Current    Types: Chew  Vaping Use   Vaping status: Never Used  Substance Use Topics   Alcohol use: Yes    Comment: occasional   Drug use: No     Allergies   Allopurinol   Review of Systems Review of Systems PER HPI  Physical Exam Triage Vital Signs ED Triage Vitals  Encounter Vitals Group     BP 05/10/23 1711 (!) 152/113     Systolic BP Percentile --      Diastolic BP Percentile --      Pulse Rate 05/10/23 1711 (!) 114     Resp 05/10/23 1711 20     Temp 05/10/23 1711 98.1 F (36.7 C)     Temp Source 05/10/23 1711 Oral  SpO2 05/10/23 1711 92 %     Weight --      Height --      Head Circumference --      Peak Flow --      Pain Score 05/10/23 1712 8     Pain Loc --      Pain Education --      Exclude from Growth Chart --    No data found.  Updated Vital Signs BP (!) 152/113 (BP Location: Right Arm)   Pulse (!) 114   Temp 98.1 F (36.7 C) (Oral)   Resp 20   SpO2 92%   Visual Acuity Right Eye Distance:   Left Eye Distance:   Bilateral Distance:    Right Eye Near:   Left Eye Near:    Bilateral Near:     Physical Exam Vitals and nursing note reviewed.  Constitutional:      Appearance: Normal appearance.  HENT:     Head: Atraumatic.  Eyes:     Extraocular Movements: Extraocular movements intact.     Conjunctiva/sclera: Conjunctivae normal.  Cardiovascular:     Rate and Rhythm: Normal rate.  Pulmonary:     Effort: Pulmonary effort is normal.   Musculoskeletal:        General: Swelling and tenderness present. Normal range of motion.     Cervical back: Normal range of motion and neck supple.  Skin:    General: Skin is warm and dry.     Findings: No erythema.  Neurological:     General: No focal deficit present.     Mental Status: He is oriented to person, place, and time.     Motor: No weakness.     Gait: Gait normal.     Comments: Left lower extremity neurovascularly intact  Psychiatric:        Mood and Affect: Mood normal.        Thought Content: Thought content normal.        Judgment: Judgment normal.      UC Treatments / Results  Labs (all labs ordered are listed, but only abnormal results are displayed) Labs Reviewed - No data to display  EKG   Radiology No results found.  Procedures Procedures (including critical care time)  Medications Ordered in UC Medications - No data to display  Initial Impression / Assessment and Plan / UC Course  I have reviewed the triage vital signs and the nursing notes.  Pertinent labs & imaging results that were available during my care of the patient were reviewed by me and considered in my medical decision making (see chart for details).     Possibly initially some Achilles tendinitis, now with symptoms consistent with his typical gout flares.  Treat with course of prednisone , continue supportive over-the-counter medications and home care.  Continue monitoring home blood pressures, discussed lifestyle changes and PCP follow-up.  Return for worsening symptoms. Final Clinical Impressions(s) / UC Diagnoses   Final diagnoses:  Left foot pain  Elevated blood pressure reading     Discharge Instructions      Follow up with your primary care provider for a recheck    ED Prescriptions     Medication Sig Dispense Auth. Provider   predniSONE  (DELTASONE ) 20 MG tablet Take 2 tablets (40 mg total) by mouth daily with breakfast. 10 tablet Stuart Vernell Norris, PA-C       PDMP not reviewed this encounter.   Stuart Vernell Norris, [place] 05/10/23 1821

## 2023-05-10 NOTE — Discharge Instructions (Signed)
 Follow up with your primary care provider for a recheck

## 2023-05-22 NOTE — Therapy (Signed)
 OUTPATIENT PHYSICAL THERAPY LOWER EXTREMITY EVALUATION   Patient Name: Ronald Gonzales MRN: [id number] DOB:1965-06-16, 58 y.o., male Today's Date: 05/24/2023  END OF SESSION:  PT End of Session - 05/24/23 0722     Visit Number 1    Number of Visits 4    Date for PT Re-Evaluation 06/21/23    Authorization Type [employer]    Authorization Time Period no auth needed    PT Start Time 0722    PT Stop Time 0800    PT Time Calculation (min) 38 min    Activity Tolerance Patient tolerated treatment well    Behavior During Therapy [facility] for tasks assessed/performed             Past Medical History:  Diagnosis Date   CKD (chronic kidney disease) stage 3, GFR 30-59 ml/min (HCC)    DVT (deep venous thrombosis) (HCC)    Gout    Hypertension    april 2017   Pulmonary emboli [facility])    Past Surgical History:  Procedure Laterality Date   dental implant     RENAL BIOPSY Left 09/21/2016   Patient Active Problem List   Diagnosis Date Noted   Chews tobacco 03/01/2023   Obesity 02/28/2023   Gout 02/28/2023   Leukocytosis 02/14/2023   Leg edema, right 01/29/2017   DVT (deep venous thrombosis) (HCC) 11/28/2015   CKD (chronic kidney disease) stage 3, GFR 30-59 ml/min (HCC) 11/28/2015   Essential hypertension 11/28/2015   Elevated troponin 11/28/2015   Acute pulmonary embolism (HCC) 11/28/2015   Adjustment disorder with depressed mood 11/28/2015    PCP: Minus Amel, MD  REFERRING PROVIDER:  Murleen Arms, MD  REFERRING DIAG: left achilles tendonitis  THERAPY DIAG:  Tendonitis, Achilles, left - Plan: PT plan of care cert/re-cert  Difficulty in walking, not elsewhere classified - Plan: PT plan of care cert/re-cert  Rationale for Evaluation and Treatment: Rehabilitation  ONSET DATE: 3 weeks or so  SUBJECTIVE:   SUBJECTIVE STATEMENT: Insidious onset of pain about 3 weeks ago.  Saw [facility]; who has been familiar with.  X-ray showed a bone spur.  He initially was having pain  heel and plantar fascia.  It is better than it was a couple weeks ago; he at that time could not even walk. Was using a knee scooter to get around and went to [facility].  Had a round of prednisone  from Urgent Care.  He referred to physical therapy. Last week was using a cane a bit but arrived without one today  PERTINENT HISTORY: History of gout PAIN:  Are you having pain? Yes: NPRS scale: 1-2/10 Pain location: back of lower left leg Pain description: tight, tender to touch, sharp Aggravating factors: weightbearing Relieving factors: rest  PRECAUTIONS: None   WEIGHT BEARING RESTRICTIONS: No  FALLS:  Has patient fallen in last 6 months? No  OCCUPATION: sit and walk  PLOF: Independent  PATIENT GOALS: not to hurt  NEXT MD VISIT: PRN  OBJECTIVE:  Note: Objective measures were completed at Evaluation unless otherwise noted.  DIAGNOSTIC FINDINGS: none  PATIENT SURVEYS:  LEFS 63/80 78.8%  COGNITION: Overall cognitive status: Within functional limits for tasks assessed     SENSATION: WFL  EDEMA:  Circumferential: right 30" left 28.5" transmalleolar  MUSCLE LENGTH: Noted tight hamstrings bilaterally PALPATION: Tender left distal achilles  LOWER EXTREMITY ROM:  Active ROM Right eval Left eval  Hip flexion    Hip extension    Hip abduction    Hip adduction  Hip internal rotation    Hip external rotation    Knee flexion    Knee extension    Ankle dorsiflexion 14 8  Ankle plantarflexion 28 28  Ankle inversion 28 10  Ankle eversion 12 10   (Blank rows = not tested)  LOWER EXTREMITY MMT:  MMT Right eval Left eval  Hip flexion    Hip extension    Hip abduction    Hip adduction    Hip internal rotation    Hip external rotation    Knee flexion    Knee extension    Ankle dorsiflexion 5 4+  Ankle plantarflexion    Ankle inversion 5 5  Ankle eversion 5 5   (Blank rows = not tested)   FUNCTIONAL TESTS:  5 times sit to stand: 13.43 no ue  assist Timed up and go (TUG): 13.89 sec SLS  next visit  GAIT: Distance walked: 50 ft in clinic Assistive device utilized: None Level of assistance: Complete Independence Comments: antalgic gait  decreased stance left lower extremity                                                                                                                                TREATMENT DATE: 05/24/23 physical therapy evaluation and HEP instruction    PATIENT EDUCATION:  Education details: Patient educated on exam findings, POC, scope of PT, HEP. Person educated: Patient Education method: Explanation, Demonstration, and Handouts Education comprehension: verbalized understanding, returned demonstration, verbal cues required, and tactile cues required HOME EXERCISE PROGRAM: Access Code: [email or web address] URL: [email or web address] Date: 05/24/2023 Prepared by: [facility] - Rehab  Exercises - Long Sitting Calf Stretch with Strap  - 2-3 x daily - 7 x weekly - 1 sets - 5 reps - 10 to 20 sec hold - Seated Hamstring Stretch  - 2-3 x daily - 7 x weekly - 1 sets - 5 reps - 10 to 20 sec  hold - Standing Gastroc Stretch at Counter  - 2-3 x daily - 7 x weekly - 1 sets - 5 reps - 10-20 sec hold - Standing Soleus Stretch  - 2-3 x daily - 7 x weekly - 1 sets - 5 reps - 10 to 20 sec hold  ASSESSMENT:  CLINICAL IMPRESSION: Patient is a 58 y.o. male who was seen today for physical therapy evaluation and treatment for left achilles tendonitis.Patient demonstrates muscle weakness, reduced ROM, and fascial restrictions which are likely contributing to symptoms of pain and are negatively impacting patient ability to perform ADLs and functional mobility tasks. Patient will benefit from skilled physical therapy services to address these deficits to reduce pain and improve level of function with ADLs and functional mobility tasks.   OBJECTIVE IMPAIRMENTS: Abnormal gait, decreased activity tolerance, difficulty walking,  decreased ROM, decreased strength, impaired perceived functional ability, and pain.   ACTIVITY LIMITATIONS: standing, squatting, stairs, and locomotion level  PARTICIPATION LIMITATIONS: driving, community activity, occupation, and yard work  [facility]  POTENTIAL: Good  CLINICAL DECISION MAKING: Evolving/moderate complexity  EVALUATION COMPLEXITY: Moderate   GOALS: Goals reviewed with patient? No  SHORT TERM GOALS: Target date: 06/07/2023 patient will be independent with initial HEP  Baseline: Goal status: INITIAL  2.  Patient will report 50% improvement overall  Baseline:  Goal status: INITIAL  LONG TERM GOALS: Target date: 06/21/2023  Patient will be independent in self management strategies to improve quality of life and functional outcomes.  Baseline:  Goal status: INITIAL  2.  Patient will report 75% improvement overall  Baseline:  Goal status: INITIAL  3.  Patient will increase left ankle dorsiflexion to 12 degrees to improve left toe off with gait pattern ; normalize gait pattern Baseline: 8 Goal status: INITIAL  4.  Patient will improve TUG score to 10 sec or less to demonstrate improved functional mobility Baseline: 13.43 Goal status: INITIAL  5.  Patient will improve LEFS score by 5 points to demonstrate improved perceived function  Baseline: 63/80 Goal status: INITIAL   PLAN:  PT FREQUENCY: 1x/week  PT DURATION: 4 weeks  PLANNED INTERVENTIONS: [id number]- PT Re-evaluation, 97110-Therapeutic exercises, 97530- Therapeutic activity, 97112- Neuromuscular re-education, 97535- Self Care, 19147- Manual therapy, [phone]- Gait training, [phone]- Orthotic Fit/training, [phone]- Canalith repositioning, [id number]- Aquatic Therapy, [phone]- Splinting, Patient/Family education, Balance training, Stair training, Taping, Dry Needling, Joint mobilization, Joint manipulation, Spinal manipulation, Spinal mobilization, Scar mobilization, and DME instructions.   PLAN FOR NEXT SESSION:  Review HEP and goals; test SLS; left ankle mobility; eccentric strengthening; 1 x a week per patient request so please update HEP each visit.     8:05 AM, 05/24/23 Cyla Haluska Small Deondra Labrador MPT [facility] [place] [phone]

## 2023-05-24 ENCOUNTER — Other Ambulatory Visit: Payer: Self-pay

## 2023-05-24 ENCOUNTER — Ambulatory Visit (HOSPITAL_COMMUNITY): Attending: Orthopedic Surgery

## 2023-05-24 DIAGNOSIS — R262 Difficulty in walking, not elsewhere classified: Secondary | ICD-10-CM | POA: Diagnosis present

## 2023-05-24 DIAGNOSIS — M7662 Achilles tendinitis, left leg: Secondary | ICD-10-CM | POA: Insufficient documentation

## 2023-05-31 ENCOUNTER — Ambulatory Visit (HOSPITAL_COMMUNITY)

## 2023-05-31 ENCOUNTER — Encounter (HOSPITAL_COMMUNITY): Payer: Self-pay

## 2023-05-31 DIAGNOSIS — R262 Difficulty in walking, not elsewhere classified: Secondary | ICD-10-CM

## 2023-05-31 DIAGNOSIS — M7662 Achilles tendinitis, left leg: Secondary | ICD-10-CM

## 2023-05-31 NOTE — Therapy (Addendum)
 OUTPATIENT PHYSICAL THERAPY LOWER EXTREMITY TREATMENT   Patient Name: Ronald Gonzales MRN: [id number] DOB:[birth year], 58 y.o., male Today's Date: 05/31/2023  END OF SESSION:  PT End of Session - 05/31/23 0804     Visit Number 2    Number of Visits 4    Date for PT Re-Evaluation 06/21/23    Authorization Type UHC    Authorization Time Period no auth needed    PT Start Time 0804    PT Stop Time 0843    PT Time Calculation (min) 39 min    Activity Tolerance Patient tolerated treatment well    Behavior During Therapy [facility] for tasks assessed/performed             Past Medical History:  Diagnosis Date   CKD (chronic kidney disease) stage 3, GFR 30-59 ml/min (HCC)    DVT (deep venous thrombosis) (HCC)    Gout    Hypertension    april 2017   Pulmonary emboli [facility])    Past Surgical History:  Procedure Laterality Date   dental implant     RENAL BIOPSY Left 09/21/2016   Patient Active Problem List   Diagnosis Date Noted   Chews tobacco 03/01/2023   Obesity 02/28/2023   Gout 02/28/2023   Leukocytosis 02/14/2023   Leg edema, right 01/29/2017   DVT (deep venous thrombosis) (HCC) 11/28/2015   CKD (chronic kidney disease) stage 3, GFR 30-59 ml/min (HCC) 11/28/2015   Essential hypertension 11/28/2015   Elevated troponin 11/28/2015   Acute pulmonary embolism (HCC) 11/28/2015   Adjustment disorder with depressed mood 11/28/2015    PCP: Minus Amel, MD  REFERRING PROVIDER:  Murleen Arms, MD  REFERRING DIAG: left achilles tendonitis  THERAPY DIAG:  Tendonitis, Achilles, left  Difficulty in walking, not elsewhere classified  Rationale for Evaluation and Treatment: Rehabilitation  ONSET DATE: 3 weeks or so  SUBJECTIVE:   SUBJECTIVE STATEMENT: 05/31/23:  Reports foot is feeling good, no reports of pain currently, feels it is healing well.    Eval:  Insidious onset of pain about 3 weeks ago.  Saw Marchwainy; who has been familiar with.  X-ray showed a bone spur.   He initially was having pain heel and plantar fascia.  It is better than it was a couple weeks ago; he at that time could not even walk. Was using a knee scooter to get around and went to [facility].  Had a round of prednisone  from Urgent Care.  He referred to physical therapy. Last week was using a cane a bit but arrived without one today  PERTINENT HISTORY: History of gout PAIN:  Are you having pain? Yes: NPRS scale: 1-2/10 Pain location: back of lower left leg Pain description: tight, tender to touch, sharp Aggravating factors: weightbearing Relieving factors: rest  PRECAUTIONS: None   WEIGHT BEARING RESTRICTIONS: No  FALLS:  Has patient fallen in last 6 months? No  OCCUPATION: sit and walk  PLOF: Independent  PATIENT GOALS: not to hurt  NEXT MD VISIT: PRN  OBJECTIVE:  Note: Objective measures were completed at Evaluation unless otherwise noted.  DIAGNOSTIC FINDINGS: none  PATIENT SURVEYS:  LEFS 63/80 78.8%  COGNITION: Overall cognitive status: Within functional limits for tasks assessed     SENSATION: WFL  EDEMA:  Circumferential: right 30" left 28.5" transmalleolar  MUSCLE LENGTH: Noted tight hamstrings bilaterally PALPATION: Tender left distal achilles  LOWER EXTREMITY ROM:  Active ROM Right eval Left eval  Hip flexion    Hip extension  Hip abduction    Hip adduction    Hip internal rotation    Hip external rotation    Knee flexion    Knee extension    Ankle dorsiflexion 14 8  Ankle plantarflexion 28 28  Ankle inversion 28 10  Ankle eversion 12 10   (Blank rows = not tested)  LOWER EXTREMITY MMT:  MMT Right eval Left eval  Hip flexion    Hip extension    Hip abduction    Hip adduction    Hip internal rotation    Hip external rotation    Knee flexion    Knee extension    Ankle dorsiflexion 5 4+  Ankle plantarflexion    Ankle inversion 5 5  Ankle eversion 5 5   (Blank rows = not tested)   FUNCTIONAL TESTS:  5 times sit to  stand: 13.43 no ue assist Timed up and go (TUG): 13.89 sec SLS 05/31/23: SLS Rt 13, 29, 10= average 17.3", Lt 20, 10, 7= 12.3"  GAIT: Distance walked: 50 ft in clinic Assistive device utilized: None Level of assistance: Complete Independence Comments: antalgic gait  decreased stance left lower extremity                                                                                                                                TREATMENT DATE:  05/31/23: - Reviewed goals - Educated importance of HEP compliance for maximal benefits -SLS Rt 13, 29, 10= average 17.3", Lt 20, 10, 7= 12.3" Seated - Seated hamstring stretch 3x 30" - Seated BAPS L3 10x (DF/PF; Inv/Ev, CW/CCW) Standing: - gastroc stretch 3x 30" against wall - soleus stretch 3x 30" - Heel raises 10x 5" - Toe raises 10x 5" - Vector stance 5x 5" - Sit to stand eccentric control 10x  05/24/23 physical therapy evaluation and HEP instruction    PATIENT EDUCATION:  Education details: Patient educated on exam findings, POC, scope of PT, HEP. Person educated: Patient Education method: Explanation, Demonstration, and Handouts Education comprehension: verbalized understanding, returned demonstration, verbal cues required, and tactile cues required HOME EXERCISE PROGRAM: Access Code: [email or web address] URL: [email or web address] Date: 05/24/2023 Prepared by: AP - Rehab  Exercises - Long Sitting Calf Stretch with Strap  - 2-3 x daily - 7 x weekly - 1 sets - 5 reps - 10 to 20 sec hold - Seated Hamstring Stretch  - 2-3 x daily - 7 x weekly - 1 sets - 5 reps - 10 to 20 sec  hold - Standing Gastroc Stretch at Counter  - 2-3 x daily - 7 x weekly - 1 sets - 5 reps - 10-20 sec hold - Standing Soleus Stretch  - 2-3 x daily - 7 x weekly - 1 sets - 5 reps - 10 to 20 sec hold  05/31/23: - Heel Toe Raises with Counter Support  - 2 x daily - 7 x weekly - 2 sets - 10 reps - 5" hold -  Standing 3-Way Kick  - 2 x daily - 7 x weekly - 1 sets  - 5 reps - 5" hold - Sit to Stand  - 2 x daily - 7 x weekly - 2 sets - 10 reps  ASSESSMENT:  CLINICAL IMPRESSION: 05/31/23:  Reviewed goals and educated importance of HEP.  Pt reports he misplaced current exercise program but has been doing the seated and standing stretches, pt given new copy of current exercise program.  Session focus with ankle mobility and strengthening.  Pt tolerated well to all exercises with good form following initial cueing and demonstration with no reports of pain.  Added hip stability exercises to assist with balance and eccentric control, added to HEP with printout given and verbalized understanding.    Eval:  Patient is a 58 y.o. male who was seen today for physical therapy evaluation and treatment for left achilles tendonitis.Patient demonstrates muscle weakness, reduced ROM, and fascial restrictions which are likely contributing to symptoms of pain and are negatively impacting patient ability to perform ADLs and functional mobility tasks. Patient will benefit from skilled physical therapy services to address these deficits to reduce pain and improve level of function with ADLs and functional mobility tasks.   OBJECTIVE IMPAIRMENTS: Abnormal gait, decreased activity tolerance, difficulty walking, decreased ROM, decreased strength, impaired perceived functional ability, and pain.   ACTIVITY LIMITATIONS: standing, squatting, stairs, and locomotion level  PARTICIPATION LIMITATIONS: driving, community activity, occupation, and yard work  [facility] POTENTIAL: Good  CLINICAL DECISION MAKING: Evolving/moderate complexity  EVALUATION COMPLEXITY: Moderate   GOALS: Goals reviewed with patient? No  SHORT TERM GOALS: Target date: 06/07/2023 patient will be independent with initial HEP  Baseline: Goal status: INITIAL  2.  Patient will report 50% improvement overall  Baseline:  Goal status: INITIAL  LONG TERM GOALS: Target date: 06/21/2023  Patient will be  independent in self management strategies to improve quality of life and functional outcomes.  Baseline:  Goal status: INITIAL  2.  Patient will report 75% improvement overall  Baseline:  Goal status: INITIAL  3.  Patient will increase left ankle dorsiflexion to 12 degrees to improve left toe off with gait pattern ; normalize gait pattern Baseline: 8 Goal status: INITIAL  4.  Patient will improve TUG score to 10 sec or less to demonstrate improved functional mobility Baseline: 13.43 Goal status: INITIAL  5.  Patient will improve LEFS score by 5 points to demonstrate improved perceived function  Baseline: 63/80 Goal status: INITIAL   PLAN:  PT FREQUENCY: 1x/week  PT DURATION: 4 weeks  PLANNED INTERVENTIONS: 97164- PT Re-evaluation, 97110-Therapeutic exercises, 97530- Therapeutic activity, 97112- Neuromuscular re-education, 97535- Self Care, 66440- Manual therapy, [phone]- Gait training, [phone]- Orthotic Fit/training, [phone]- Canalith repositioning, [id number]- Aquatic Therapy, [phone]- Splinting, Patient/Family education, Balance training, Stair training, Taping, Dry Needling, Joint mobilization, Joint manipulation, Spinal manipulation, Spinal mobilization, Scar mobilization, and DME instructions.   PLAN FOR NEXT SESSION: left ankle mobility; eccentric strengthening; 1 x a week per patient request so please update HEP each visit.  Begin stair training next session.  Minor Amble, LPTA/CLT; CBIS [phone]  8:49 AM, 05/31/23

## 2023-06-13 ENCOUNTER — Encounter (HOSPITAL_COMMUNITY)

## 2023-06-20 ENCOUNTER — Ambulatory Visit (HOSPITAL_COMMUNITY)

## 2023-06-20 DIAGNOSIS — M7662 Achilles tendinitis, left leg: Secondary | ICD-10-CM | POA: Diagnosis not present

## 2023-06-20 DIAGNOSIS — R262 Difficulty in walking, not elsewhere classified: Secondary | ICD-10-CM

## 2023-06-20 NOTE — Therapy (Signed)
 OUTPATIENT PHYSICAL THERAPY LOWER EXTREMITY TREATMENT   Patient Name: Ronald Gonzales MRN: [id number] DOB:Sep 01, 1965, 57 y.o., male Today's Date: 06/20/2023  END OF SESSION:  PT End of Session - 06/20/23 0729     Visit Number 3    Number of Visits 4    Date for PT Re-Evaluation 06/21/23    Authorization Type UHC    Authorization Time Period no auth needed    PT Start Time 0728   late check in   PT Stop Time 0800    PT Time Calculation (min) 32 min    Activity Tolerance Patient tolerated treatment well    Behavior During Therapy [facility] for tasks assessed/performed             Past Medical History:  Diagnosis Date   CKD (chronic kidney disease) stage 3, GFR 30-59 ml/min (HCC)    DVT (deep venous thrombosis) (HCC)    Gout    Hypertension    april 2017   Pulmonary emboli [facility])    Past Surgical History:  Procedure Laterality Date   dental implant     RENAL BIOPSY Left 09/21/2016   Patient Active Problem List   Diagnosis Date Noted   Chews tobacco 03/01/2023   Obesity 02/28/2023   Gout 02/28/2023   Leukocytosis 02/14/2023   Leg edema, right 01/29/2017   DVT (deep venous thrombosis) (HCC) 11/28/2015   CKD (chronic kidney disease) stage 3, GFR 30-59 ml/min (HCC) 11/28/2015   Essential hypertension 11/28/2015   Elevated troponin 11/28/2015   Acute pulmonary embolism (HCC) 11/28/2015   Adjustment disorder with depressed mood 11/28/2015    PCP: Minus Amel, MD  REFERRING PROVIDER:  Murleen Arms, MD  REFERRING DIAG: left achilles tendonitis  THERAPY DIAG:  Tendonitis, Achilles, left  Difficulty in walking, not elsewhere classified  Rationale for Evaluation and Treatment: Rehabilitation  ONSET DATE: 3 weeks or so  SUBJECTIVE:   SUBJECTIVE STATEMENT: Late arrival today; Started having some pain on top of his left foot; no pain in back of his foot/achilles area  Eval:  Insidious onset of pain about 3 weeks ago.  Saw Marchwainy; who has been familiar  with.  X-ray showed a bone spur.  He initially was having pain heel and plantar fascia.  It is better than it was a couple weeks ago; he at that time could not even walk. Was using a knee scooter to get around and went to [facility].  Had a round of prednisone  from Urgent Care.  He referred to physical therapy. Last week was using a cane a bit but arrived without one today  PERTINENT HISTORY: History of gout PAIN:  Are you having pain? Yes: NPRS scale: 1-2/10 Pain location: back of lower left leg Pain description: tight, tender to touch, sharp Aggravating factors: weightbearing Relieving factors: rest  PRECAUTIONS: None   WEIGHT BEARING RESTRICTIONS: No  FALLS:  Has patient fallen in last 6 months? No  OCCUPATION: sit and walk  PLOF: Independent  PATIENT GOALS: not to hurt  NEXT MD VISIT: PRN  OBJECTIVE:  Note: Objective measures were completed at Evaluation unless otherwise noted.  DIAGNOSTIC FINDINGS: none  PATIENT SURVEYS:  LEFS 63/80 78.8%  COGNITION: Overall cognitive status: Within functional limits for tasks assessed     SENSATION: WFL  EDEMA:  Circumferential: right 30" left 28.5" transmalleolar  MUSCLE LENGTH: Noted tight hamstrings bilaterally PALPATION: Tender left distal achilles  LOWER EXTREMITY ROM:  Active ROM Right eval Left eval  Hip flexion  Hip extension    Hip abduction    Hip adduction    Hip internal rotation    Hip external rotation    Knee flexion    Knee extension    Ankle dorsiflexion 14 8  Ankle plantarflexion 28 28  Ankle inversion 28 10  Ankle eversion 12 10   (Blank rows = not tested)  LOWER EXTREMITY MMT:  MMT Right eval Left eval  Hip flexion    Hip extension    Hip abduction    Hip adduction    Hip internal rotation    Hip external rotation    Knee flexion    Knee extension    Ankle dorsiflexion 5 4+  Ankle plantarflexion    Ankle inversion 5 5  Ankle eversion 5 5   (Blank rows = not  tested)   FUNCTIONAL TESTS:  5 times sit to stand: 13.43 no ue assist Timed up and go (TUG): 13.89 sec SLS 05/31/23: SLS Rt 13, 29, 10= average 17.3", Lt 20, 10, 7= 12.3"  GAIT: Distance walked: 50 ft in clinic Assistive device utilized: None Level of assistance: Complete Independence Comments: antalgic gait  decreased stance left lower extremity                                                                                                                                TREATMENT DATE:  06/20/23 Nustep seat 10 x 5' level 3 dynamic warm up Standing: Gastroc stretch 5 x 20" Soleus stretch trial (painful so d/c) Trial of kinesitape to anterior foot for swelling. Mini squats 2 x 10    05/31/23: - Reviewed goals - Educated importance of HEP compliance for maximal benefits -SLS Rt 13, 29, 10= average 17.3", Lt 20, 10, 7= 12.3" Seated - Seated hamstring stretch 3x 30" - Seated BAPS L3 10x (DF/PF; Inv/Ev, CW/CCW) Standing: - gastroc stretch 3x 30" against wall - soleus stretch 3x 30" - Heel raises 10x 5" - Toe raises 10x 5" - Vector stance 5x 5" - Sit to stand eccentric control 10x  05/24/23 physical therapy evaluation and HEP instruction    PATIENT EDUCATION:  Education details: Patient educated on exam findings, POC, scope of PT, HEP. Person educated: Patient Education method: Explanation, Demonstration, and Handouts Education comprehension: verbalized understanding, returned demonstration, verbal cues required, and tactile cues required HOME EXERCISE PROGRAM: Access Code: [email or web address] URL: [email or web address] Date: 05/24/2023 Prepared by: AP - Rehab  Exercises - Long Sitting Calf Stretch with Strap  - 2-3 x daily - 7 x weekly - 1 sets - 5 reps - 10 to 20 sec hold - Seated Hamstring Stretch  - 2-3 x daily - 7 x weekly - 1 sets - 5 reps - 10 to 20 sec  hold - Standing Gastroc Stretch at Counter  - 2-3 x daily - 7 x weekly - 1 sets - 5 reps - 10-20 sec hold -  Standing Soleus Stretch  - 2-3  x daily - 7 x weekly - 1 sets - 5 reps - 10 to 20 sec hold  05/31/23: - Heel Toe Raises with Counter Support  - 2 x daily - 7 x weekly - 2 sets - 10 reps - 5" hold - Standing 3-Way Kick  - 2 x daily - 7 x weekly - 1 sets - 5 reps - 5" hold - Sit to Stand  - 2 x daily - 7 x weekly - 2 sets - 10 reps  ASSESSMENT:  CLINICAL IMPRESSION: Late check in today.  Started on Nutep today for warm up.  He is having more forefoot area pain today and noted swelling anterior ankle and dorsum of ankle so trial of kinesiotape for swelling.  Continue with there ex and stretching although some anterior foot pain noted with soleus stretching so d/c.  Will reassess next visit.  Patient will benefit from continued skilled therapy services to address deficits and promote return to optimal function.        Eval:  Patient is a 58 y.o. male who was seen today for physical therapy evaluation and treatment for left achilles tendonitis.Patient demonstrates muscle weakness, reduced ROM, and fascial restrictions which are likely contributing to symptoms of pain and are negatively impacting patient ability to perform ADLs and functional mobility tasks. Patient will benefit from skilled physical therapy services to address these deficits to reduce pain and improve level of function with ADLs and functional mobility tasks.   OBJECTIVE IMPAIRMENTS: Abnormal gait, decreased activity tolerance, difficulty walking, decreased ROM, decreased strength, impaired perceived functional ability, and pain.   ACTIVITY LIMITATIONS: standing, squatting, stairs, and locomotion level  PARTICIPATION LIMITATIONS: driving, community activity, occupation, and yard work  [facility] POTENTIAL: Good  CLINICAL DECISION MAKING: Evolving/moderate complexity  EVALUATION COMPLEXITY: Moderate   GOALS: Goals reviewed with patient? No  SHORT TERM GOALS: Target date: 06/07/2023 patient will be independent with initial  HEP  Baseline: Goal status: in progress  2.  Patient will report 50% improvement overall  Baseline:  Goal status: in progress  LONG TERM GOALS: Target date: 06/21/2023  Patient will be independent in self management strategies to improve quality of life and functional outcomes.  Baseline:  Goal status: in progress  2.  Patient will report 75% improvement overall  Baseline:  Goal status: in progress  3.  Patient will increase left ankle dorsiflexion to 12 degrees to improve left toe off with gait pattern ; normalize gait pattern Baseline: 8 Goal status: in progress  4.  Patient will improve TUG score to 10 sec or less to demonstrate improved functional mobility Baseline: 13.43 Goal status: in progress   5.  Patient will improve LEFS score by 5 points to demonstrate improved perceived function  Baseline: 63/80 Goal status: in progress    PLAN:  PT FREQUENCY: 1x/week  PT DURATION: 4 weeks  PLANNED INTERVENTIONS: 97164- PT Re-evaluation, 97110-Therapeutic exercises, 97530- Therapeutic activity, 97112- Neuromuscular re-education, 97535- Self Care, 04540- Manual therapy, [phone]- Gait training, [phone]- Orthotic Fit/training, [phone]- Canalith repositioning, [id number]- Aquatic Therapy, [phone]- Splinting, Patient/Family education, Balance training, Stair training, Taping, Dry Needling, Joint mobilization, Joint manipulation, Spinal manipulation, Spinal mobilization, Scar mobilization, and DME instructions.   PLAN FOR NEXT SESSION: left ankle mobility; eccentric strengthening; 1 x a week per patient request so please update HEP each visit.  Begin stair training next session. Reassess next visit 8:00 AM, 06/20/23 Evanee Lubrano Small Yussuf Sawyers MPT [facility] [place] [phone]

## 2023-06-28 ENCOUNTER — Ambulatory Visit (HOSPITAL_COMMUNITY): Attending: Orthopedic Surgery

## 2023-06-28 DIAGNOSIS — M7662 Achilles tendinitis, left leg: Secondary | ICD-10-CM | POA: Insufficient documentation

## 2023-06-28 DIAGNOSIS — R262 Difficulty in walking, not elsewhere classified: Secondary | ICD-10-CM | POA: Insufficient documentation

## 2023-06-28 NOTE — Therapy (Signed)
 OUTPATIENT PHYSICAL THERAPY LOWER EXTREMITY TREATMENT/ PROGRESS NOTE Progress Note Reporting Period 05/24/23 to 06/28/23  See note below for Objective Data and Assessment of Progress/Goals.   PHYSICAL THERAPY DISCHARGE SUMMARY  Visits from Start of Care: 4  Current functional level related to goals / functional outcomes: See below   Remaining deficits: See below   Education / Equipment: HEP   Patient agrees to discharge. Patient goals were met. Patient is being discharged due to meeting the stated rehab goals. All except goal for functional survey LEFS but did improve by a point.       Patient Name: IZAK ANDING MRN: [id number] DOB:April 08, 1965, 58 y.o., male Today's Date: 06/28/2023  END OF SESSION:  PT End of Session - 06/28/23 0727     Visit Number 4    Number of Visits 4    Date for PT Re-Evaluation 06/21/23    Authorization Type [employer]    Authorization Time Period no auth needed    PT Start Time 0725    PT Stop Time 0805    PT Time Calculation (min) 40 min    Activity Tolerance Patient tolerated treatment well    Behavior During Therapy [facility] for tasks assessed/performed             Past Medical History:  Diagnosis Date   CKD (chronic kidney disease) stage 3, GFR 30-59 ml/min (HCC)    DVT (deep venous thrombosis) (HCC)    Gout    Hypertension    april 2017   Pulmonary emboli [facility])    Past Surgical History:  Procedure Laterality Date   dental implant     RENAL BIOPSY Left 09/21/2016   Patient Active Problem List   Diagnosis Date Noted   Chews tobacco 03/01/2023   Obesity 02/28/2023   Gout 02/28/2023   Leukocytosis 02/14/2023   Leg edema, right 01/29/2017   DVT (deep venous thrombosis) (HCC) 11/28/2015   CKD (chronic kidney disease) stage 3, GFR 30-59 ml/min (HCC) 11/28/2015   Essential hypertension 11/28/2015   Elevated troponin 11/28/2015   Acute pulmonary embolism (HCC) 11/28/2015   Adjustment disorder with depressed mood 11/28/2015    PCP:  Minus Amel, MD  REFERRING PROVIDER:  Murleen Arms, MD  REFERRING DIAG: left achilles tendonitis  THERAPY DIAG:  Tendonitis, Achilles, left  Difficulty in walking, not elsewhere classified  Rationale for Evaluation and Treatment: Rehabilitation  ONSET DATE: 3 weeks or so  SUBJECTIVE:   SUBJECTIVE STATEMENT: Has had gout in his right foot; has been on prednisone  but even before that his left foot has felt better.  70-80% better overall  Eval:  Insidious onset of pain about 3 weeks ago.  Saw March[facility]ainy; who has been familiar with.  X-ray showed a bone spur.  He initially was having pain heel and plantar fascia.  It is better than it was a couple weeks ago; he at that time could not even walk. Was using a knee scooter to get around and went to [facility].  Had a round of prednisone  from Urgent Care.  He referred to physical therapy. Last week was using a cane a bit but arrived without one today  PERTINENT HISTORY: History of gout PAIN:  Are you having pain? Yes: NPRS scale: 1-2/10 Pain location: back of lower left leg Pain description: tight, tender to touch, sharp Aggravating factors: weightbearing Relieving factors: rest  PRECAUTIONS: None   WEIGHT BEARING RESTRICTIONS: No  FALLS:  Has patient fallen in last 6 months? No  OCCUPATION:  sit and walk  PLOF: Independent  PATIENT GOALS: not to hurt  NEXT MD VISIT: PRN  OBJECTIVE:  Note: Objective measures were completed at Evaluation unless otherwise noted.  DIAGNOSTIC FINDINGS: none  PATIENT SURVEYS:  LEFS 63/80 78.8%  COGNITION: Overall cognitive status: Within functional limits for tasks assessed     SENSATION: WFL  EDEMA:  Circumferential: right 30" left 28.5" transmalleolar  MUSCLE LENGTH: Noted tight hamstrings bilaterally PALPATION: Tender left distal achilles  LOWER EXTREMITY ROM:  Active ROM Right eval Left eval Left 06/28/23  Hip flexion     Hip extension     Hip abduction      Hip adduction     Hip internal rotation     Hip external rotation     Knee flexion     Knee extension     Ankle dorsiflexion 14 8 12   Ankle plantarflexion 28 28   Ankle inversion 28 10 20   Ankle eversion 12 10    (Blank rows = not tested)  LOWER EXTREMITY MMT:  MMT Right eval Left eval Left 06/28/23  Hip flexion     Hip extension     Hip abduction     Hip adduction     Hip internal rotation     Hip external rotation     Knee flexion     Knee extension     Ankle dorsiflexion 5 4+ 5  Ankle plantarflexion     Ankle inversion 5 5   Ankle eversion 5 5    (Blank rows = not tested)   FUNCTIONAL TESTS:  5 times sit to stand: 13.43 no ue assist Timed up and go (TUG): 13.89 sec SLS 05/31/23: SLS Rt 13, 29, 10= average 17.3", Lt 20, 10, 7= 12.3"  GAIT: Distance walked: 50 ft in clinic Assistive device utilized: None Level of assistance: Complete Independence Comments: antalgic gait  decreased stance left lower extremity                                                                                                                                TREATMENT DATE:  06/28/23 Nustep seat 10 x 5' level 3 dynamic warm up Progress note LEFS 64/80 SLS Right 18"; Left 15" AROM see above MMT's see above 5 times sit to stand 16.44 sec TUG 10.03 sec  06/20/23 Nustep seat 10 x 5' level 3 dynamic warm up Standing: Gastroc stretch 5 x 20" Soleus stretch trial (painful so d/c) Trial of kinesitape to anterior foot for swelling. Mini squats 2 x 10    05/31/23: - Reviewed goals - Educated importance of HEP compliance for maximal benefits -SLS Rt 13, 29, 10= average 17.3", Lt 20, 10, 7= 12.3" Seated - Seated hamstring stretch 3x 30" - Seated BAPS L3 10x (DF/PF; Inv/Ev, CW/CCW) Standing: - gastroc stretch 3x 30" against wall - soleus stretch 3x 30" - Heel raises 10x 5" - Toe raises 10x 5" - Vector stance  5x 5" - Sit to stand eccentric control 10x  05/24/23 physical therapy  evaluation and HEP instruction    PATIENT EDUCATION:  Education details: Patient educated on exam findings, POC, scope of PT, HEP. Person educated: Patient Education method: Explanation, Demonstration, and Handouts Education comprehension: verbalized understanding, returned demonstration, verbal cues required, and tactile cues required HOME EXERCISE PROGRAM: Access Code: [email or web address] URL: [email or web address] Date: 05/24/2023 Prepared by: AP - Rehab  Exercises - Long Sitting Calf Stretch with Strap  - 2-3 x daily - 7 x weekly - 1 sets - 5 reps - 10 to 20 sec hold - Seated Hamstring Stretch  - 2-3 x daily - 7 x weekly - 1 sets - 5 reps - 10 to 20 sec  hold - Standing Gastroc Stretch at Counter  - 2-3 x daily - 7 x weekly - 1 sets - 5 reps - 10-20 sec hold - Standing Soleus Stretch  - 2-3 x daily - 7 x weekly - 1 sets - 5 reps - 10 to 20 sec hold  05/31/23: - Heel Toe Raises with Counter Support  - 2 x daily - 7 x weekly - 2 sets - 10 reps - 5" hold - Standing 3-Way Kick  - 2 x daily - 7 x weekly - 1 sets - 5 reps - 5" hold - Sit to Stand  - 2 x daily - 7 x weekly - 2 sets - 10 reps  ASSESSMENT:  CLINICAL IMPRESSION: Progress note today.  6/7 goals met and patient is agreeable to discharge at this time.    Eval:  Patient is a 58 y.o. male who was seen today for physical therapy evaluation and treatment for left achilles tendonitis.Patient demonstrates muscle weakness, reduced ROM, and fascial restrictions which are likely contributing to symptoms of pain and are negatively impacting patient ability to perform ADLs and functional mobility tasks. Patient will benefit from skilled physical therapy services to address these deficits to reduce pain and improve level of function with ADLs and functional mobility tasks.   OBJECTIVE IMPAIRMENTS: Abnormal gait, decreased activity tolerance, difficulty walking, decreased ROM, decreased strength, impaired perceived functional ability,  and pain.   ACTIVITY LIMITATIONS: standing, squatting, stairs, and locomotion level  PARTICIPATION LIMITATIONS: driving, community activity, occupation, and yard work  [facility] POTENTIAL: Good  CLINICAL DECISION MAKING: Evolving/moderate complexity  EVALUATION COMPLEXITY: Moderate   GOALS: Goals reviewed with patient? No  SHORT TERM GOALS: Target date: 06/07/2023 patient will be independent with initial HEP  Baseline: Goal status: met  2.  Patient will report 50% improvement overall  Baseline:  Goal status: met  LONG TERM GOALS: Target date: 06/21/2023  Patient will be independent in self management strategies to improve quality of life and functional outcomes.  Baseline:  Goal status: met  2.  Patient will report 75% improvement overall  Baseline: 70% better Goal status: met  3.  Patient will increase left ankle dorsiflexion to 12 degrees to improve left toe off with gait pattern ; normalize gait pattern Baseline: 8, 12 06/28/23 Goal status:met  4.  Patient will improve TUG score to 10 sec or less to demonstrate improved functional mobility Baseline: 13.43; 10.03 Goal status: met  5.  Patient will improve LEFS score by 5 points to demonstrate improved perceived function  Baseline: 63/80, 64/80 Goal status: in progress    PLAN:  PT FREQUENCY: 1x/week  PT DURATION: 4 weeks  PLANNED INTERVENTIONS: 97164- PT Re-evaluation, 97110-Therapeutic exercises, 97530- Therapeutic activity, [id number]- Neuromuscular re-education, 97535- Self Care,  82956- Manual therapy, [id number]- Gait training, 21308- Orthotic Fit/training, 65784- Canalith repositioning, 69629- Aquatic Therapy, [phone]- Splinting, Patient/Family education, Balance training, Stair training, Taping, Dry Needling, Joint mobilization, Joint manipulation, Spinal manipulation, Spinal mobilization, Scar mobilization, and DME instructions.   PLAN FOR NEXT SESSION: discharge  7:59 AM, 06/28/23 Emmory Solivan Small Shannon Kirkendall MPT [facility] [place] [phone]

## 2023-07-31 ENCOUNTER — Encounter (HOSPITAL_COMMUNITY): Payer: Self-pay | Admitting: *Deleted

## 2023-07-31 ENCOUNTER — Emergency Department (HOSPITAL_COMMUNITY)

## 2023-07-31 ENCOUNTER — Emergency Department (HOSPITAL_COMMUNITY)
Admission: EM | Admit: 2023-07-31 | Discharge: 2023-07-31 | Disposition: A | Discharge status: Home / Self Care | Attending: Emergency Medicine | Admitting: Emergency Medicine

## 2023-07-31 ENCOUNTER — Other Ambulatory Visit: Payer: Self-pay

## 2023-07-31 DIAGNOSIS — Z794 Long term (current) use of insulin: Secondary | ICD-10-CM | POA: Insufficient documentation

## 2023-07-31 DIAGNOSIS — R079 Chest pain, unspecified: Secondary | ICD-10-CM | POA: Diagnosis present

## 2023-07-31 DIAGNOSIS — R0789 Other chest pain: Secondary | ICD-10-CM | POA: Diagnosis not present

## 2023-07-31 LAB — CBC WITH DIFFERENTIAL/PLATELET
Abs Immature Granulocytes: 0.02 K/uL (ref 0.00–0.07)
Basophils Absolute: 0.1 K/uL (ref 0.0–0.1)
Basophils Relative: 1 %
Eosinophils Absolute: 0.1 K/uL (ref 0.0–0.5)
Eosinophils Relative: 2 %
HCT: 49 % (ref 39.0–52.0)
Hemoglobin: 15.3 g/dL (ref 13.0–17.0)
Immature Granulocytes: 0 %
Lymphocytes Relative: 17 %
Lymphs Abs: 1.4 K/uL (ref 0.7–4.0)
MCH: 27.3 pg (ref 26.0–34.0)
MCHC: 31.2 g/dL (ref 30.0–36.0)
MCV: 87.3 fL (ref 80.0–100.0)
Monocytes Absolute: 0.9 K/uL (ref 0.1–1.0)
Monocytes Relative: 11 %
Neutro Abs: 5.4 K/uL (ref 1.7–7.7)
Neutrophils Relative %: 69 %
Platelets: 267 K/uL (ref 150–400)
RBC: 5.61 MIL/uL (ref 4.22–5.81)
RDW: 14.2 % (ref 11.5–15.5)
WBC: 7.9 K/uL (ref 4.0–10.5)
nRBC: 0 % (ref 0.0–0.2)

## 2023-07-31 LAB — TROPONIN I (HIGH SENSITIVITY)
Troponin I (High Sensitivity): 8 ng/L (ref <18)
Troponin I (High Sensitivity): 7 ng/L (ref <18)

## 2023-07-31 LAB — COMPREHENSIVE METABOLIC PANEL WITH GFR
ALT: 20 U/L (ref 0–44)
AST: 18 U/L (ref 15–41)
Albumin: 3.3 g/dL — ABNORMAL LOW (ref 3.5–5.0)
Alkaline Phosphatase: 64 U/L (ref 38–126)
Anion gap: 9 (ref 5–15)
BUN: 34 mg/dL — ABNORMAL HIGH (ref 6–20)
CO2: 24 mmol/L (ref 22–32)
Calcium: 9.2 mg/dL (ref 8.9–10.3)
Chloride: 105 mmol/L (ref 98–111)
Creatinine, Ser: 3.39 mg/dL — ABNORMAL HIGH (ref 0.61–1.24)
GFR, Estimated: 20 mL/min — ABNORMAL LOW (ref >60)
Glucose, Bld: 92 mg/dL (ref 70–99)
Potassium: 4.2 mmol/L (ref 3.5–5.1)
Sodium: 138 mmol/L (ref 135–145)
Total Bilirubin: 1.4 mg/dL — ABNORMAL HIGH (ref 0.0–1.2)
Total Protein: 7.2 g/dL (ref 6.5–8.1)

## 2023-07-31 LAB — D-DIMER, QUANTITATIVE: D-Dimer, Quant: 0.53 ug{FEU}/mL — ABNORMAL HIGH (ref 0.00–0.50)

## 2023-07-31 NOTE — Discharge Instructions (Signed)
 Follow-up with the cardiologist in the next couple weeks.  Return sooner if any problems

## 2023-07-31 NOTE — ED Triage Notes (Signed)
 Pt c/o chest pressure to middle of chest that started x 2 days ago  Pt has hx of PE in 2017

## 2023-07-31 NOTE — ED Provider Notes (Signed)
 [place] EMERGENCY DEPARTMENT AT [facility] Provider Note   CSN: [id number] Arrival date & time: 07/31/23  9277     Patient presents with: Chest Pain   Ronald Gonzales is a 58 y.o. male.   Patient has been having some chest pressure.  He is under a lot of stress because his mother died  The history is provided by the patient and medical records. No language interpreter was used.  Chest Pain Pain location:  L chest Pain quality: aching   Pain radiates to:  Does not radiate Pain severity:  Moderate Onset quality:  Sudden Timing:  Constant Progression:  Waxing and waning Chronicity:  New Context: not breathing   Relieved by:  Nothing Associated symptoms: no abdominal pain, no back pain, no cough, no fatigue and no headache        Prior to Admission medications   Medication Sig Start Date End Date Taking? Authorizing Provider  FARXIGA 10 MG TABS tablet Take 10 mg by mouth daily. 01/28/23  Yes [provider]  febuxostat (ULORIC) 40 MG tablet Take 40 mg by mouth daily. 01/28/23  Yes [provider]  predniSONE  (DELTASONE ) 10 MG tablet Take 10 mg by mouth. 06/25/23  Yes [provider]  tadalafil (CIALIS) 5 MG tablet Take 5 mg by mouth daily as needed for erectile dysfunction. 06/03/23  Yes [provider]  TART CHERRY PO Take by mouth.   Yes [provider]  XARELTO  20 MG TABS tablet Take 20 mg by mouth daily. 02/08/23  Yes [provider]  MOUNJARO 2.5 MG/0.5ML Pen Inject 2.5 mg into the skin once a week. 02/25/23   [provider]    Allergies: Allopurinol    Review of Systems  Constitutional:  Negative for appetite change and fatigue.  HENT:  Negative for congestion, ear discharge and sinus pressure.   Eyes:  Negative for discharge.  Respiratory:  Negative for cough.   Cardiovascular:  Positive for chest pain.  Gastrointestinal:  Negative for abdominal pain and diarrhea.  Genitourinary:  Negative for  frequency and hematuria.  Musculoskeletal:  Negative for back pain.  Skin:  Negative for rash.  Neurological:  Negative for seizures and headaches.  Psychiatric/Behavioral:  Negative for hallucinations.     Updated Vital Signs BP (!) 143/103   Pulse 71   Temp 98.3 F (36.8 C) (Oral)   Resp 12   Ht 5' 10 (1.778 m)   Wt 136.1 kg   SpO2 96%   BMI 43.05 kg/m   Physical Exam Vitals and nursing note reviewed.  Constitutional:      Appearance: He is well-developed.  HENT:     Head: Normocephalic.     Nose: Nose normal.  Eyes:     General: No scleral icterus.    Conjunctiva/sclera: Conjunctivae normal.  Neck:     Thyroid : No thyromegaly.  Cardiovascular:     Rate and Rhythm: Normal rate and regular rhythm.     Heart sounds: No murmur heard.    No friction rub. No gallop.  Pulmonary:     Breath sounds: No stridor. No wheezing or rales.  Chest:     Chest wall: Tenderness present.  Abdominal:     General: There is no distension.     Tenderness: There is no abdominal tenderness. There is no rebound.  Musculoskeletal:        General: Normal range of motion.     Cervical back: Neck supple.  Lymphadenopathy:  Cervical: No cervical adenopathy.  Skin:    Findings: No erythema or rash.  Neurological:     Mental Status: He is alert and oriented to person, place, and time.     Motor: No abnormal muscle tone.     Coordination: Coordination normal.  Psychiatric:        Behavior: Behavior normal.     (all labs ordered are listed, but only abnormal results are displayed) Labs Reviewed  COMPREHENSIVE METABOLIC PANEL WITH GFR - Abnormal; Notable for the following components:      Result Value   BUN 34 (*)    Creatinine, Ser 3.39 (*)    Albumin  3.3 (*)    Total Bilirubin 1.4 (*)    GFR, Estimated 20 (*)    All other components within normal limits  D-DIMER, QUANTITATIVE - Abnormal; Notable for the following components:   D-Dimer, Quant 0.53 (*)    All other components  within normal limits  CBC WITH DIFFERENTIAL/PLATELET  TROPONIN I (HIGH SENSITIVITY)  TROPONIN I (HIGH SENSITIVITY)    EKG: EKG Interpretation Date/Time:  Wednesday July 31 2023 07:32:15 EDT Ventricular Rate:  90 PR Interval:  159 QRS Duration:  84 QT Interval:  364 QTC Calculation: 446 R Axis:   38  Text Interpretation: Sinus rhythm Confirmed by Theadore Sharper [phone]) on 07/31/2023 12:07:33 PM  Radiology: [facility] Chest Port 1 View Result Date: 07/31/2023 CLINICAL DATA:  Chest pressure EXAM: PORTABLE CHEST - 1 VIEW COMPARISON:  November 28, 2015 FINDINGS: Lower lung volumes. No focal airspace consolidation, pleural effusion, or pneumothorax. The cardiac silhouette is at the upper limits of normal, likely accentuated by AP technique and low lung volumes. No acute fracture or destructive lesions. Multilevel thoracic osteophytosis. IMPRESSION: No acute cardiopulmonary abnormality. Electronically Signed   By: Rogelia Myers M.D.   On: 07/31/2023 08:22     Procedures   Medications Ordered in the ED - No data to display      Cardiac Monitoring: / EKG:  The patient was maintained on a cardiac monitor.  I personally viewed and interpreted the cardiac monitored which showed an underlying rhythm of: nsr                                   Medical Decision Making Amount and/or Complexity of Data Reviewed Labs: ordered. Radiology: ordered.   Patient with atypical chest discomfort.  Labs unremarkable.  Patient is referred to cardiology     Final diagnoses:  Atypical chest pain    ED Discharge Orders          Ordered    Ambulatory referral to Cardiology       Comments: If you have not heard from the Cardiology office within the next 72 hours please call [phone].   07/31/23 1219               Suzette Pac, MD 08/03/23 [phone]

## 2023-10-16 ENCOUNTER — Ambulatory Visit
Admission: EM | Admit: 2023-10-16 | Discharge: 2023-10-16 | Disposition: A | Discharge status: Home / Self Care | Attending: Family Medicine | Admitting: Family Medicine

## 2023-10-16 DIAGNOSIS — R030 Elevated blood-pressure reading, without diagnosis of hypertension: Secondary | ICD-10-CM

## 2023-10-16 DIAGNOSIS — M109 Gout, unspecified: Secondary | ICD-10-CM | POA: Diagnosis not present

## 2023-10-16 MED ORDER — PREDNISONE 20 MG PO TABS: 40.0000 mg | ORAL_TABLET | Freq: Every day | ORAL | 0 refills | Status: AC

## 2023-10-16 NOTE — ED Triage Notes (Signed)
 Thinks he has gout in left ankle.  Pain started last night with some swelling.  Hx of gout in that ankle

## 2023-10-16 NOTE — ED Provider Notes (Signed)
 [facility]    CSN: [id number] Arrival date & time: 10/16/23  1324      History   Chief Complaint No chief complaint on file.   HPI Ronald Gonzales is a 58 y.o. male.   Patient presenting today with new onset left ankle redness, swelling, severe pain.  States symptoms started last night after eating Jodie [employer].  History of gout that always presents similarly.  Currently on Uloric, still getting gout flares fairly frequently.    Past Medical History:  Diagnosis Date   CKD (chronic kidney disease) stage 3, GFR 30-59 ml/min (HCC)    DVT (deep venous thrombosis) (HCC)    Gout    Hypertension    april 2017   Pulmonary emboli [facility])     Patient Active Problem List   Diagnosis Date Noted   Chews tobacco 03/01/2023   Obesity 02/28/2023   Gout 02/28/2023   Leukocytosis 02/14/2023   Leg edema, right 01/29/2017   DVT (deep venous thrombosis) (HCC) 11/28/2015   CKD (chronic kidney disease) stage 3, GFR 30-59 ml/min (HCC) 11/28/2015   Essential hypertension 11/28/2015   Elevated troponin 11/28/2015   Acute pulmonary embolism (HCC) 11/28/2015   Adjustment disorder with depressed mood 11/28/2015    Past Surgical History:  Procedure Laterality Date   dental implant     RENAL BIOPSY Left 09/21/2016       Home Medications    Prior to Admission medications   Medication Sig Start Date End Date Taking? Authorizing Provider  predniSONE  (DELTASONE ) 20 MG tablet Take 2 tablets (40 mg total) by mouth daily with breakfast. 10/16/23  Yes Stuart Vernell Norris, PA-C  febuxostat (ULORIC) 40 MG tablet Take 40 mg by mouth daily. 01/28/23   [provider]  MOUNJARO 2.5 MG/0.5ML Pen Inject 2.5 mg into the skin once a week. 02/25/23   [provider]  tadalafil (CIALIS) 5 MG tablet Take 5 mg by mouth daily as needed for erectile dysfunction. 06/03/23   [provider]  TART CHERRY PO Take by mouth.    [provider]  XARELTO  20 MG TABS tablet  Take 20 mg by mouth daily. 02/08/23   [provider]    Family History Family History  Problem Relation Age of Onset   CVA Father     Social History Social History   Tobacco Use   Smoking status: Never   Smokeless tobacco: Current    Types: Chew  Vaping Use   Vaping status: Never Used  Substance Use Topics   Alcohol use: Yes    Comment: occasional   Drug use: No     Allergies   Allopurinol   Review of Systems Review of Systems PER HPI  Physical Exam Triage Vital Signs ED Triage Vitals  Encounter Vitals Group     BP 10/16/23 1412 (!) 165/108     Girls Systolic BP Percentile --      Girls Diastolic BP Percentile --      Boys Systolic BP Percentile --      Boys Diastolic BP Percentile --      Pulse Rate 10/16/23 1409 94     Resp 10/16/23 1409 18     Temp 10/16/23 1409 98.1 F (36.7 C)     Temp Source 10/16/23 1409 Oral     SpO2 10/16/23 1409 95 %     Weight --      Height --      Head Circumference --  Peak Flow --      Pain Score 10/16/23 1410 8     Pain Loc --      Pain Education --      Exclude from Growth Chart --    No data found.  Updated Vital Signs BP (!) 165/108 (BP Location: Right Arm)   Pulse 94   Temp 98.1 F (36.7 C) (Oral)   Resp 18   SpO2 95%   Visual Acuity Right Eye Distance:   Left Eye Distance:   Bilateral Distance:    Right Eye Near:   Left Eye Near:    Bilateral Near:     Physical Exam Vitals and nursing note reviewed.  Constitutional:      Appearance: Normal appearance.  HENT:     Head: Atraumatic.  Eyes:     Extraocular Movements: Extraocular movements intact.     Conjunctiva/sclera: Conjunctivae normal.  Cardiovascular:     Rate and Rhythm: Normal rate.  Pulmonary:     Effort: Pulmonary effort is normal.  Musculoskeletal:        General: Swelling and tenderness present. No signs of injury. Normal range of motion.     Cervical back: Normal range of motion and neck supple.  Skin:    General:  Skin is warm and dry.     Findings: Erythema present.  Neurological:     Mental Status: He is oriented to person, place, and time.     Comments: Left lower extremity neurovascularly intact  Psychiatric:        Mood and Affect: Mood normal.        Thought Content: Thought content normal.        Judgment: Judgment normal.      UC Treatments / Results  Labs (all labs ordered are listed, but only abnormal results are displayed) Labs Reviewed - No data to display  EKG   Radiology No results found.  Procedures Procedures (including critical care time)  Medications Ordered in UC Medications - No data to display  Initial Impression / Assessment and Plan / UC Course  I have reviewed the triage vital signs and the nursing notes.  Pertinent labs & imaging results that were available during my care of the patient were reviewed by me and considered in my medical decision making (see chart for details).     Acute gout flare of the left ankle, treat with prednisone , tart cherry, support over-the-counter medications and home care continue gout regimen.  Blood pressure significantly elevated, he states he was taken off his blood pressure medication last year and plans to talk with his primary care provider about restarting.  Continue monitoring home blood pressures and follow-up with soon as possible.  Final Clinical Impressions(s) / UC Diagnoses   Final diagnoses:  Acute gout of left ankle, unspecified cause  Elevated blood pressure reading   Discharge Instructions   None    ED Prescriptions     Medication Sig Dispense Auth. Provider   predniSONE  (DELTASONE ) 20 MG tablet Take 2 tablets (40 mg total) by mouth daily with breakfast. 14 tablet Stuart Vernell Norris, [place]      PDMP not reviewed this encounter.   Stuart Vernell Norris, [place] 10/16/23 1538

## 2023-10-25 ENCOUNTER — Ambulatory Visit: Attending: Cardiovascular Disease | Admitting: Cardiovascular Disease

## 2023-10-25 ENCOUNTER — Encounter: Payer: Self-pay | Admitting: Cardiovascular Disease

## 2023-10-25 VITALS — BP 156/96 | HR 81 | Ht 70.0 in | Wt 312.0 lb

## 2023-10-25 DIAGNOSIS — R072 Precordial pain: Secondary | ICD-10-CM

## 2023-10-25 DIAGNOSIS — Z8739 Personal history of other diseases of the musculoskeletal system and connective tissue: Secondary | ICD-10-CM

## 2023-10-25 DIAGNOSIS — N184 Chronic kidney disease, stage 4 (severe): Secondary | ICD-10-CM

## 2023-10-25 DIAGNOSIS — E785 Hyperlipidemia, unspecified: Secondary | ICD-10-CM

## 2023-10-25 DIAGNOSIS — Z131 Encounter for screening for diabetes mellitus: Secondary | ICD-10-CM

## 2023-10-25 DIAGNOSIS — I10 Essential (primary) hypertension: Secondary | ICD-10-CM

## 2023-10-25 DIAGNOSIS — N529 Male erectile dysfunction, unspecified: Secondary | ICD-10-CM

## 2023-10-25 NOTE — Progress Notes (Signed)
 Cardiology Office Note:    Date:  10/27/2023   ID:  Ronald Gonzales, DOB [birth year], MRN [id number]  PCP:  Marvine Rush, MD   [facility] Providers Cardiologist:  None     Referring MD: Suzette Pac, MD   No chief complaint on file.   History of Present Illness:    Ronald Gonzales is a 58 y.o. male with a hx of right femoral DVT/pulmonary embolism (2017), chronic kidney disease stage IV, prediabetes, erectile dysfunction, HTN, hyperlipidemia, gout who presents for cardiac evaluation primarily at the insistence of his wife.  He denies exertional angina or dyspnea.  In 07-21-2025after his mother passed away, he had an episode of chest pain that made him worry that he may have had another pulmonary embolism, since the symptoms were somehow reminiscent.  It was associate with dyspnea, diaphoresis and a racing heartbeat.  He went to the emergency room.  His blood pressure was markedly elevated but he had reproducible anterior chest pain, tender to palpation.  His D-dimer was 0.53 and high-sensitivity troponin was completely normal.  His creatinine at that time was 3.39 (GFR 20).  ECG shows sinus rhythm and was otherwise normal.  No additional testing was performed and he was referred to cardiology.  I took care of his mother, Ronald Gonzales, for many years until she passed away recently.  Ronald Gonzales had early onset CAD and underwent bypass surgery as well as statin myopathy but she did well with PCSK9 inhibitors.  He also take care of one of his sisters who has a history of atrial fibrillation related systemic embolism.  He denies problems with lower extremity edema, orthopnea or PND.  Outside of the episode of chest pain he denies palpitations and he has not had dizziness or syncope.  He has a standing prescription for prednisone  for gout attacks, which used to be relatively frequent.  He took allopurinol and that did not help, but he has had no episodes of gout since he started taking Uloric.    He reports that in the past his blood pressure was well-controlled with lisinopril and is not sure why it was stopped.  Today his blood pressure was markedly elevated on arrival at 171/108, although it did improve slightly during the visit.  He previously saw Dr. Almarie Bonine at [facility], but is now seeing a new nephrologist in the [facility] system.  Labs performed earlier this year showed a markedly elevated LDL cholesterol 165, with a decent HDL of 59 and normal triglycerides.  Hemoglobin A1c of 6.2% places him in the prediabetes range. He is firmly opposed to statins, since he saw serious side effects from these medications and both his mother and his father.  The topic does not seem to be open for discussion.  He used to have a prescription for Doreen but is not taking it (maybe it was stopped due to low GFR?).  He currently has a prescription for Mounjaro but has not actually started it yet.  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   Past Medical History:  Diagnosis Date   CKD (chronic kidney disease) stage 3, GFR 30-59 ml/min (HCC)    DVT (deep venous thrombosis) (HCC)    Gout    Hypertension    april 2017   Pulmonary emboli [facility])     Past Surgical History:  Procedure Laterality Date   dental implant     RENAL BIOPSY Left 09/21/2016  Current Medications: Current Meds  Medication Sig   carvedilol (COREG) 6.25 MG tablet Take 1 tablet (6.25 mg total) by mouth 2 (two) times daily.   febuxostat (ULORIC) 40 MG tablet Take 40 mg by mouth daily.   MOUNJARO 2.5 MG/0.5ML Pen Inject 2.5 mg into the skin once a week.   predniSONE  (DELTASONE ) 20 MG tablet Take 2 tablets (40 mg total) by mouth daily with breakfast.   tadalafil (CIALIS) 5 MG tablet Take 5 mg by mouth daily as needed for erectile dysfunction.   TART CHERRY PO Take by mouth.   XARELTO  20 MG TABS tablet Take 20 mg by mouth daily.     Allergies:   Allopurinol   Family  History: The patient's family history includes CVA in his father. CAD and CABG in his mother.  Atrial fibrillation with systemic embolism in his sister.  ROS:   Please see the history of present illness.     All other systems reviewed and are negative.  EKGs/Labs/Other Studies Reviewed:    The following studies were reviewed today: ECG, labs, chest x-ray from ER visit 07/31/2023      Recent Labs: 07/31/2023: ALT 20; Hemoglobin 15.3; Platelets 267 10/25/2023: BUN 41; Creatinine, Ser 3.28; Potassium 5.1; Sodium 138  Recent Lipid Panel    Component Value Date/Time   CHOL 242 (H) 10/25/2023 1106   TRIG 102 10/25/2023 1106   HDL 61 10/25/2023 1106   CHOLHDL 4.0 10/25/2023 1106   LDLCALC 163 (H) 10/25/2023 1106   01/29/2023 Cholesterol 246, HDL 59, LDL 165, triglycerides 123 Hemoglobin A1c 6.2%, TSH 0.783     Risk Assessment/Calculations:      BP remains elevated even when rechecked.  Waiting for labs before choosing medications.       Physical Exam:    VS:  BP (!) 156/96   Pulse 81   Ht 5' 10 (1.778 m)   Wt (!) 312 lb (141.5 kg)   SpO2 92%   BMI 44.77 kg/m     Wt Readings from Last 3 Encounters:  10/25/23 (!) 312 lb (141.5 kg)  07/31/23 300 lb (136.1 kg)  02/28/23 (!) 317 lb 10.9 oz (144.1 kg)     GEN: Morbidly obese, well nourished, well developed in no acute distress HEENT: Normal NECK: No JVD; No carotid bruits LYMPHATICS: No lymphadenopathy CARDIAC: RRR, no murmurs, rubs, gallops RESPIRATORY:  Clear to auscultation without rales, wheezing or rhonchi  ABDOMEN: Soft, non-tender, non-distended MUSCULOSKELETAL:  No edema; No deformity  SKIN: Warm and dry NEUROLOGIC:  Alert and oriented x 3 PSYCHIATRIC:  Normal affect   ASSESSMENT:    1. Precordial pain   2. Essential hypertension   3. Morbid obesity (HCC)   4. Hyperlipidemia LDL goal <70   5. Screening for diabetes mellitus (DM)   6. History of gout   7. Erectile dysfunction, unspecified erectile  dysfunction type    PLAN:    In order of problems listed above:  Chest pain: This occurred in July, no significant episodes since.  Was felt to be associated reproducible tenderness during his ER evaluation.  On the other hand he has a wider range of poorly addressed coronary risk factors including severe hypertension, markedly elevated LDL cholesterol, prediabetes, morbid obesity and a family history of CAD.  He is not a good candidate for contrast based angiography due to his kidney dysfunction.  Will schedule him for a PET scan. CKD4: Sees a nephrologist in the [facility] system.  Most recent creatinine levels showed  GFR in the 20-25 range.  Probably should avoid ACE inhibitors, angiotensin receptor blockers, aldosterone antagonists.  Also pointed out that we should avoid iodinated contrast based procedures unless absolutely necessary.  Avoid NSAIDs. HTN: Severely elevated.  He reports doing well on lisinopril in the past and is not sure why this was stopped.  I suspect it may have been due to his renal dysfunction.  My preference would be to start treatment with carvedilol and/or amlodipine.  Will get an update of his renal function first.  Avoid thiazide diuretics due to gout. Morbid obesity: I encouraged him to start the treatment with Mounjaro.  I am sure that his metabolic abnormalities and his blood pressure would improve significantly if he loses substantial weight. HLP: He has a markedly elevated LDL cholesterol and meets criteria for lipid-lowering medication even if we do not identify the presence of vascular disease.  I think it is quite likely will find some evidence of vascular disease eventually, due to his multiple risk factors.  Would set a target LDL cholesterol of 70 or less.  He is firmly opposed to statins.  I tried to point out that just because other members of his family had side effects, it does not mean that these medications would not work for him and that we have multiple  statins to choose from.  However he is firm in his opinion I do not think we can change it.  Will have to explore whether his insurance company will cover Repatha, Praluent or Leqvio if we have not first tried statins.  For the time being, we have decided to see what happens with his lipid profile after he takes Mounjaro and loses weight over the next several months. PreDM: Hemoglobin A1c 6.2%.  Starting Mounjaro. Gout: Marked improvement in gout attack prevalence after he started Uloric.  He did not do well with allopurinol.  Unfortunately Uloric is associated with an increased incidence of hypertension, heart failure, heart attack although this appears to be limited to elderly patients. ED: Uses PDE 5 inhibitors.  Avoid nitrates.      Informed Consent   Shared Decision Making/Informed Consent The risks [chest pain, shortness of breath, cardiac arrhythmias, dizziness, blood pressure fluctuations, myocardial infarction, stroke/transient ischemic attack, nausea, vomiting, allergic reaction, radiation exposure, metallic taste sensation and life-threatening complications (estimated to be 1 in 10,000)], benefits (risk stratification, diagnosing coronary artery disease, treatment guidance) and alternatives of a cardiac PET stress test were discussed in detail with Ronald Gonzales and he agrees to proceed.       Medication Adjustments/Labs and Tests Ordered: Current medicines are reviewed at length with the patient today.  Concerns regarding medicines are outlined above.  Orders Placed This Encounter  Procedures   NM PET CT CARDIAC PERFUSION MULTI W/ABSOLUTE BLOODFLOW   Lipid panel   HgB A1c   Basic Metabolic Panel (BMET)   Cardiac Stress Test: Informed Consent Details: Physician/Practitioner Attestation; Transcribe to consent form and obtain patient signature   Meds ordered this encounter  Medications   carvedilol (COREG) 6.25 MG tablet    Sig: Take 1 tablet (6.25 mg total) by mouth 2 (two) times  daily.    Dispense:  180 tablet    Refill:  3    Patient Instructions    Testing/Procedures:    Please report to Radiology at the [facility] Main Entrance 30 minutes early for your test.  [address] [place], [place] [address]   How to Prepare for Your Cardiac  PET/CT Stress Test:  Nothing to eat or drink, except water, 3 hours prior to arrival time.  NO caffeine/decaffeinated products, or chocolate 12 hours prior to arrival. (Please note decaffeinated beverages (teas/coffees) still contain caffeine).  If you have caffeine within 12 hours prior, the test will need to be rescheduled.  Medication instructions: Do not take erectile dysfunction medications for 72 hours prior to test (sildenafil, tadalafil)  You may take your remaining medications with water.  NO perfume, cologne or lotion on chest or abdomen area.   In preparation for your appointment, medication and supplies will be purchased.  Appointment availability is limited, so if you need to cancel or reschedule, please call the Radiology Department Scheduler at [phone] 24 hours in advance to avoid a cancellation fee of $100.00  What to Expect When you Arrive:  Once you arrive and check in for your appointment, you will be taken to a preparation room within the Radiology Department.  A technologist or Nurse will obtain your medical history, verify that you are correctly prepped for the exam, and explain the procedure.  Afterwards, an IV will be started in your arm and electrodes will be placed on your skin for EKG monitoring during the stress portion of the exam. Then you will be escorted to the PET/CT scanner.  There, staff will get you positioned on the scanner and obtain a blood pressure and EKG.  During the exam, you will continue to be connected to the EKG and blood pressure machines.  A small, safe amount of a radioactive tracer will be injected in your IV to obtain a series of pictures of your heart  along with an injection of a stress agent.    After your Exam:  It is recommended that you eat a meal and drink a caffeinated beverage to counter act any effects of the stress agent.  Drink plenty of fluids for the remainder of the day and urinate frequently for the first couple of hours after the exam.  Your doctor will inform you of your test results within 7-10 business days.  For more information and frequently asked questions, please visit our website: [email or web address]  For questions about your test or how to prepare for your test, please call: Cardiac Imaging Nurse Navigators Office: [phone]   Follow-Up: At [facility], you and your health needs are our priority.  As part of our continuing mission to provide you with exceptional heart care, our providers are all part of one team.  This team includes your primary Cardiologist (physician) and Advanced Practice Providers or APPs (Physician Assistants and Nurse Practitioners) who all work together to provide you with the care you need, when you need it.  Your next appointment:   3 month(s)  Provider:   JEREL BALDING MD             Signed, JEREL BALDING, MD  10/27/2023 2:31 PM    [facility]

## 2023-10-25 NOTE — Patient Instructions (Signed)
   Testing/Procedures:    Please report to Radiology at the [facility] Main Entrance 30 minutes early for your test.  [address] [place], [place] [address]   How to Prepare for Your Cardiac PET/CT Stress Test:  Nothing to eat or drink, except water, 3 hours prior to arrival time.  NO caffeine/decaffeinated products, or chocolate 12 hours prior to arrival. (Please note decaffeinated beverages (teas/coffees) still contain caffeine).  If you have caffeine within 12 hours prior, the test will need to be rescheduled.  Medication instructions: Do not take erectile dysfunction medications for 72 hours prior to test (sildenafil, tadalafil)  You may take your remaining medications with water.  NO perfume, cologne or lotion on chest or abdomen area.   In preparation for your appointment, medication and supplies will be purchased.  Appointment availability is limited, so if you need to cancel or reschedule, please call the Radiology Department Scheduler at [phone] 24 hours in advance to avoid a cancellation fee of $100.00  What to Expect When you Arrive:  Once you arrive and check in for your appointment, you will be taken to a preparation room within the Radiology Department.  A technologist or Nurse will obtain your medical history, verify that you are correctly prepped for the exam, and explain the procedure.  Afterwards, an IV will be started in your arm and electrodes will be placed on your skin for EKG monitoring during the stress portion of the exam. Then you will be escorted to the PET/CT scanner.  There, staff will get you positioned on the scanner and obtain a blood pressure and EKG.  During the exam, you will continue to be connected to the EKG and blood pressure machines.  A small, safe amount of a radioactive tracer will be injected in your IV to obtain a series of pictures of your heart along with an injection of a stress agent.    After your Exam:  It is  recommended that you eat a meal and drink a caffeinated beverage to counter act any effects of the stress agent.  Drink plenty of fluids for the remainder of the day and urinate frequently for the first couple of hours after the exam.  Your doctor will inform you of your test results within 7-10 business days.  For more information and frequently asked questions, please visit our website: [email or web address]  For questions about your test or how to prepare for your test, please call: Cardiac Imaging Nurse Navigators Office: [phone]   Follow-Up: At [facility], you and your health needs are our priority.  As part of our continuing mission to provide you with exceptional heart care, our providers are all part of one team.  This team includes your primary Cardiologist (physician) and Advanced Practice Providers or APPs (Physician Assistants and Nurse Practitioners) who all work together to provide you with the care you need, when you need it.  Your next appointment:   3 month(s)  Provider:   JEREL BALDING MD

## 2023-10-26 ENCOUNTER — Ambulatory Visit: Payer: Self-pay | Admitting: Cardiovascular Disease

## 2023-10-26 LAB — LIPID PANEL
Chol/HDL Ratio: 4 ratio (ref 0.0–5.0)
Cholesterol, Total: 242 mg/dL — ABNORMAL HIGH (ref 100–199)
HDL: 61 mg/dL (ref >39)
LDL Chol Calc (NIH): 163 mg/dL — ABNORMAL HIGH (ref 0–99)
Triglycerides: 102 mg/dL (ref 0–149)
VLDL Cholesterol Cal: 18 mg/dL (ref 5–40)

## 2023-10-26 LAB — HEMOGLOBIN A1C
Est. average glucose Bld gHb Est-mCnc: 126 mg/dL
Hgb A1c MFr Bld: 6 % — ABNORMAL HIGH (ref 4.8–5.6)

## 2023-10-26 LAB — BASIC METABOLIC PANEL WITH GFR
BUN/Creatinine Ratio: 13 (ref 9–20)
BUN: 41 mg/dL — ABNORMAL HIGH (ref 6–24)
CO2: 18 mmol/L — ABNORMAL LOW (ref 20–29)
Calcium: 8.9 mg/dL (ref 8.7–10.2)
Chloride: 106 mmol/L (ref 96–106)
Creatinine, Ser: 3.28 mg/dL — ABNORMAL HIGH (ref 0.76–1.27)
Glucose: 103 mg/dL — ABNORMAL HIGH (ref 70–99)
Potassium: 5.1 mmol/L (ref 3.5–5.2)
Sodium: 138 mmol/L (ref 134–144)
eGFR: 21 mL/min/1.73 — ABNORMAL LOW (ref >59)

## 2023-10-27 ENCOUNTER — Encounter: Payer: Self-pay | Admitting: Cardiovascular Disease

## 2023-10-27 MED ORDER — CARVEDILOL 6.25 MG PO TABS: 6.2500 mg | ORAL_TABLET | Freq: Two times a day (BID) | ORAL | 3 refills | Status: AC

## 2023-10-28 ENCOUNTER — Telehealth (HOSPITAL_COMMUNITY): Payer: Self-pay | Admitting: Emergency Medicine

## 2023-10-28 NOTE — Telephone Encounter (Signed)
 Attempted to call patient regarding upcoming cardiac CT appointment. Left message on voicemail with name and callback number Rockwell Alexandria RN Navigator Cardiac Imaging [facility] [phone] Office [phone] Cell

## 2023-10-29 ENCOUNTER — Ambulatory Visit (HOSPITAL_COMMUNITY)
Admission: RE | Admit: 2023-10-29 | Discharge: 2023-10-29 | Disposition: A | Discharge status: Home / Self Care | Source: Ambulatory Visit | Attending: Cardiovascular Disease | Admitting: Cardiovascular Disease

## 2023-10-29 DIAGNOSIS — R072 Precordial pain: Secondary | ICD-10-CM | POA: Insufficient documentation

## 2023-10-29 MED ORDER — REGADENOSON 0.4 MG/5ML IV SOLN
INTRAVENOUS | Status: AC
  Filled 2023-10-29: qty 5

## 2023-10-29 MED ORDER — RUBIDIUM RB82 GENERATOR (RUBYFILL)
26.4100 | PACK | Freq: Once | INTRAVENOUS | Status: AC
Start: 2023-10-29 — End: 2023-10-29
  Administered 2023-10-29: 26.41 via INTRAVENOUS

## 2023-10-29 MED ORDER — REGADENOSON 0.4 MG/5ML IV SOLN
0.4000 mg | Freq: Once | INTRAVENOUS | Status: AC
  Administered 2023-10-29: 0.4 mg via INTRAVENOUS

## 2023-10-29 MED ORDER — RUBIDIUM RB82 GENERATOR (RUBYFILL)
27.1500 | PACK | Freq: Once | INTRAVENOUS | Status: AC
  Administered 2023-10-29: 27.15 via INTRAVENOUS

## 2023-10-29 NOTE — Progress Notes (Signed)
 Pt. Tolerated lexi scan well.

## 2023-10-30 LAB — NM PET CT CARDIAC PERFUSION MULTI W/ABSOLUTE BLOODFLOW
MBFR: 2.17
Nuc Rest EF: 55 %
Nuc Stress EF: 59 %
Rest MBF: 0.98 ml/g/min
Rest Nuclear Isotope Dose: 27.2 mCi
ST Depression (mm): 0 mm
Stress MBF: 2.13 ml/g/min
Stress Nuclear Isotope Dose: 26.4 mCi
TID: 1.18

## 2023-10-31 NOTE — Telephone Encounter (Signed)
 Left message and call back number for the patient to receive Cardiac PET Results.

## 2023-11-01 ENCOUNTER — Telehealth: Payer: Self-pay | Admitting: Cardiovascular Disease

## 2023-11-01 NOTE — Telephone Encounter (Signed)
 Pt called in returning call to [facility] call for test results.    [phone]

## 2023-11-01 NOTE — Telephone Encounter (Signed)
 Left voice message to call back 10/10

## 2023-11-04 NOTE — Telephone Encounter (Signed)
 Spoke with pt. Made pt aware for carvedilol. Pt asked about refills on his mounjaro and I advised to reach out to PCP for those refills. Pt stated understanding.

## 2023-11-04 NOTE — Telephone Encounter (Signed)
 Pt returning call

## 2024-01-29 ENCOUNTER — Encounter: Payer: Self-pay | Admitting: Cardiovascular Disease
